# Patient Record
Sex: Male | Born: 1970 | Race: Black or African American | Hispanic: No | Marital: Single | Smoking: Former smoker
Health system: Southern US, Community
[De-identification: ages and names within clinical notes are randomized; demographics above are authoritative.]

## PROBLEM LIST (undated history)

## (undated) DIAGNOSIS — I10 Essential (primary) hypertension: Secondary | ICD-10-CM

## (undated) DIAGNOSIS — E119 Type 2 diabetes mellitus without complications: Secondary | ICD-10-CM

## (undated) HISTORY — DX: Type 2 diabetes mellitus without complications: E11.9

---

## 2004-07-21 ENCOUNTER — Emergency Department (HOSPITAL_COMMUNITY): Admission: EM | Admit: 2004-07-21 | Discharge: 2004-07-21 | Payer: Self-pay | Admitting: Emergency Medicine

## 2005-11-05 IMAGING — CR DG FINGER MIDDLE 2+V*L*
3 series · 3 of 3 positions shown · non-contrast
Comparison: None.

CLINICAL DATA: Swelling and pain at third DIP joint. 
 LEFT MIDDLE FINGER - 3 VIEW:

[x finger pa left]
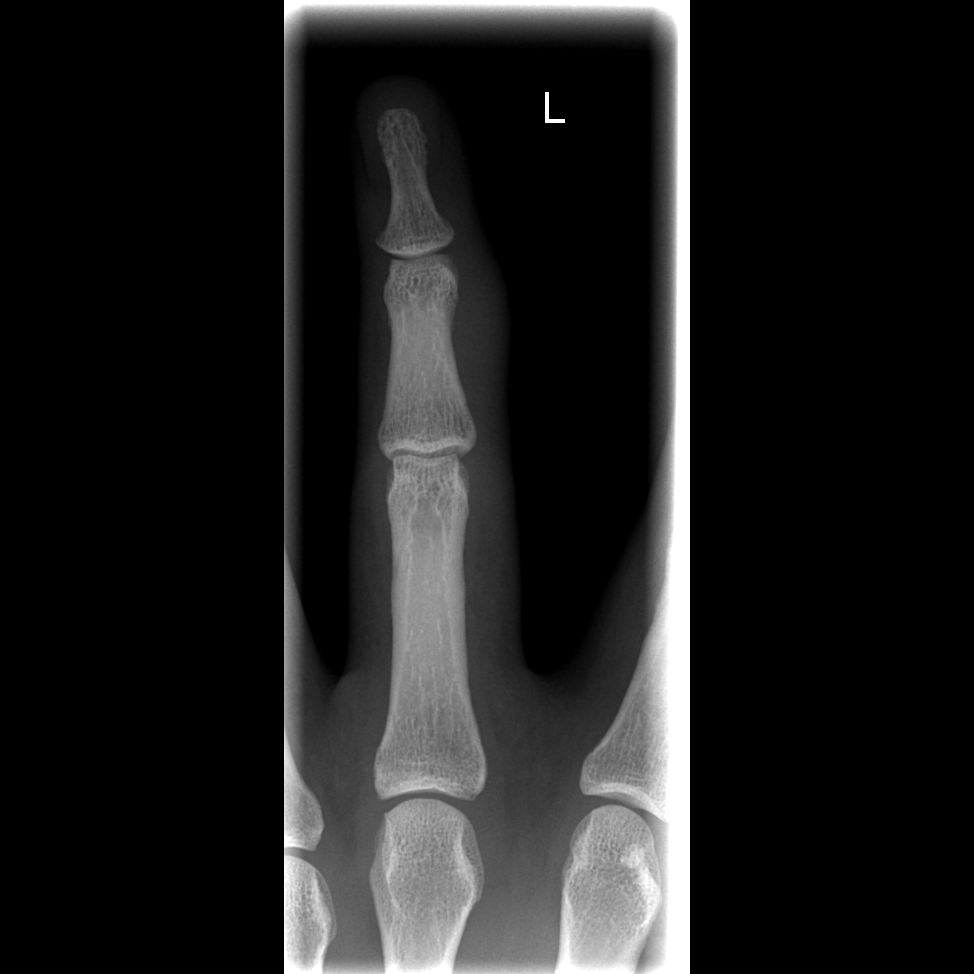

[x finger obl. left]
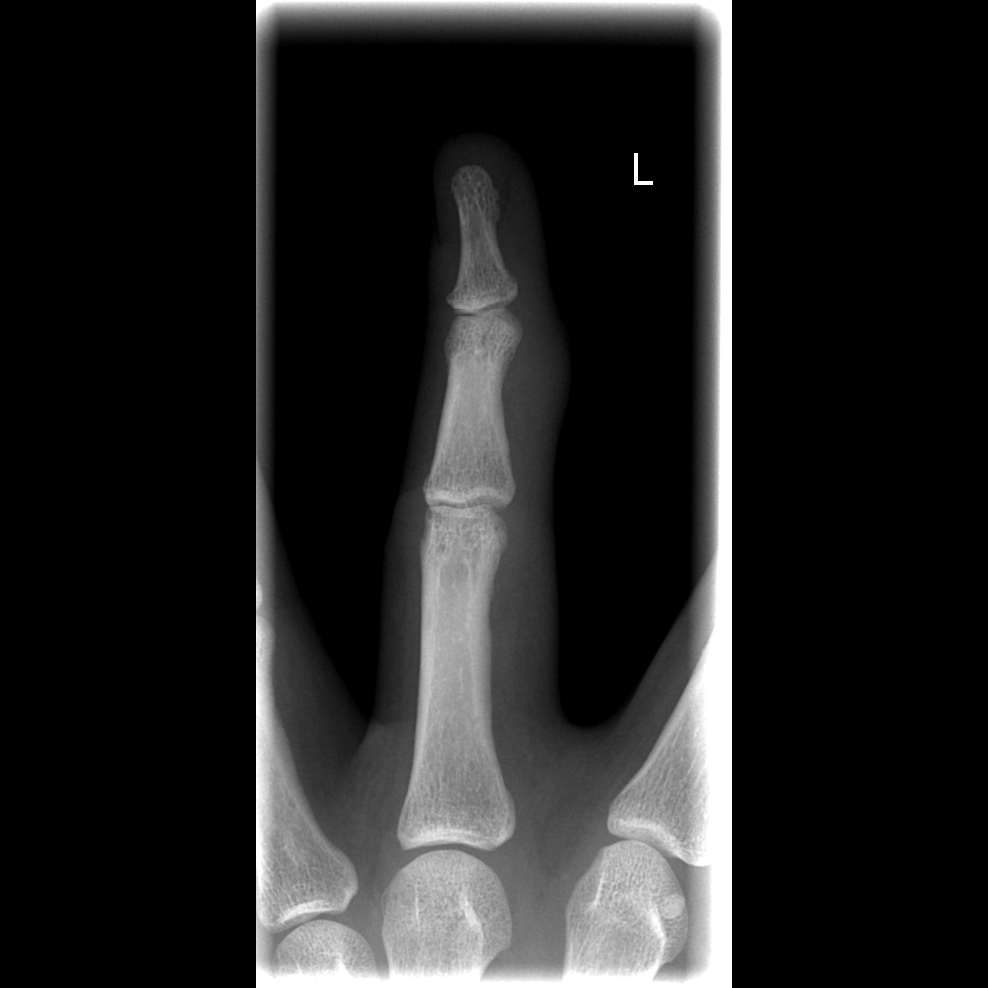

[x finger lateral left]
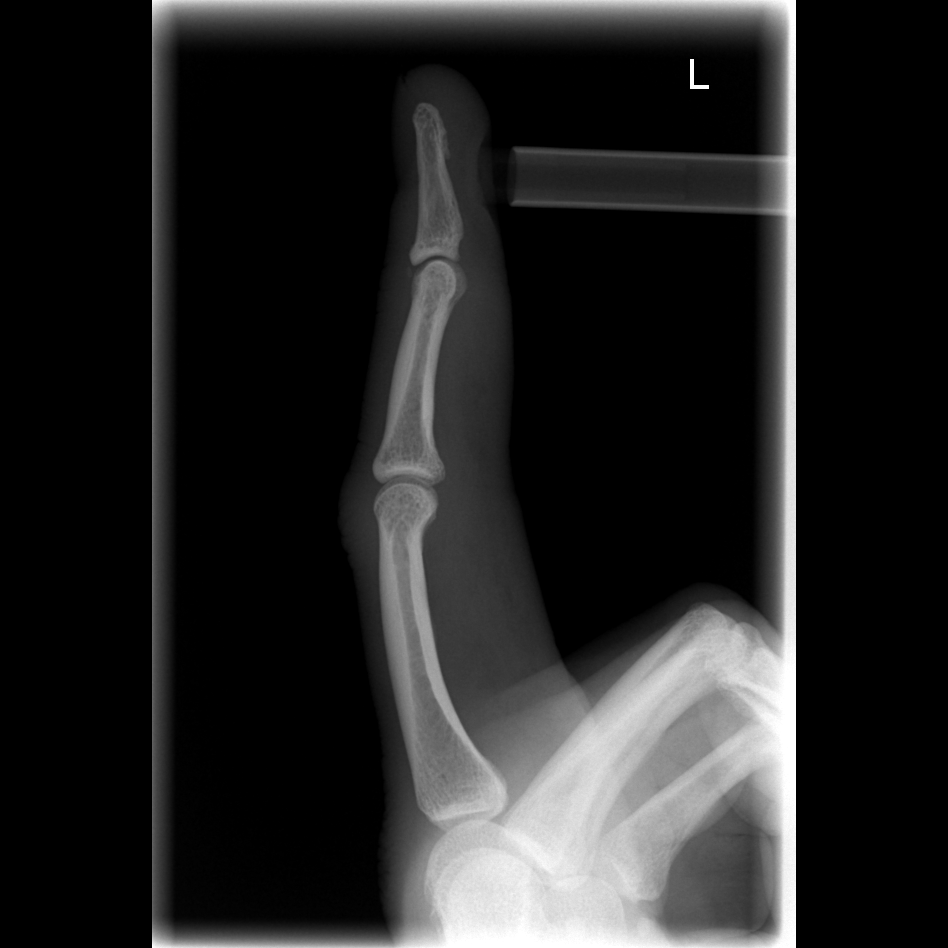

[3 of 3 positions shown; findings below may reference images not displayed]

There is soft tissue swelling adjacent to the third DIP joint.  No radiopaque foreign bodies are identified.  The underlying osseous structures appear intact.
IMPRESSION: Soft tissue swelling.

## 2006-01-09 ENCOUNTER — Emergency Department (HOSPITAL_COMMUNITY): Admission: EM | Admit: 2006-01-09 | Discharge: 2006-01-09 | Payer: Self-pay | Admitting: Emergency Medicine

## 2006-02-11 ENCOUNTER — Ambulatory Visit: Payer: Self-pay | Admitting: Internal Medicine

## 2006-02-15 ENCOUNTER — Emergency Department (HOSPITAL_COMMUNITY): Admission: EM | Admit: 2006-02-15 | Discharge: 2006-02-15 | Payer: Self-pay | Admitting: Emergency Medicine

## 2006-02-27 ENCOUNTER — Ambulatory Visit: Payer: Self-pay | Admitting: *Deleted

## 2006-03-13 ENCOUNTER — Ambulatory Visit: Payer: Self-pay | Admitting: Internal Medicine

## 2006-11-26 ENCOUNTER — Encounter (INDEPENDENT_AMBULATORY_CARE_PROVIDER_SITE_OTHER): Payer: Self-pay | Admitting: *Deleted

## 2007-04-26 IMAGING — CR DG CHEST 2V
2 series · 2 of 2 positions shown · non-contrast
Comparison: None

CLINICAL DATA: Arrhythmia. Smoker. 
 CHEST - 2 VIEW:

[w chest pa]
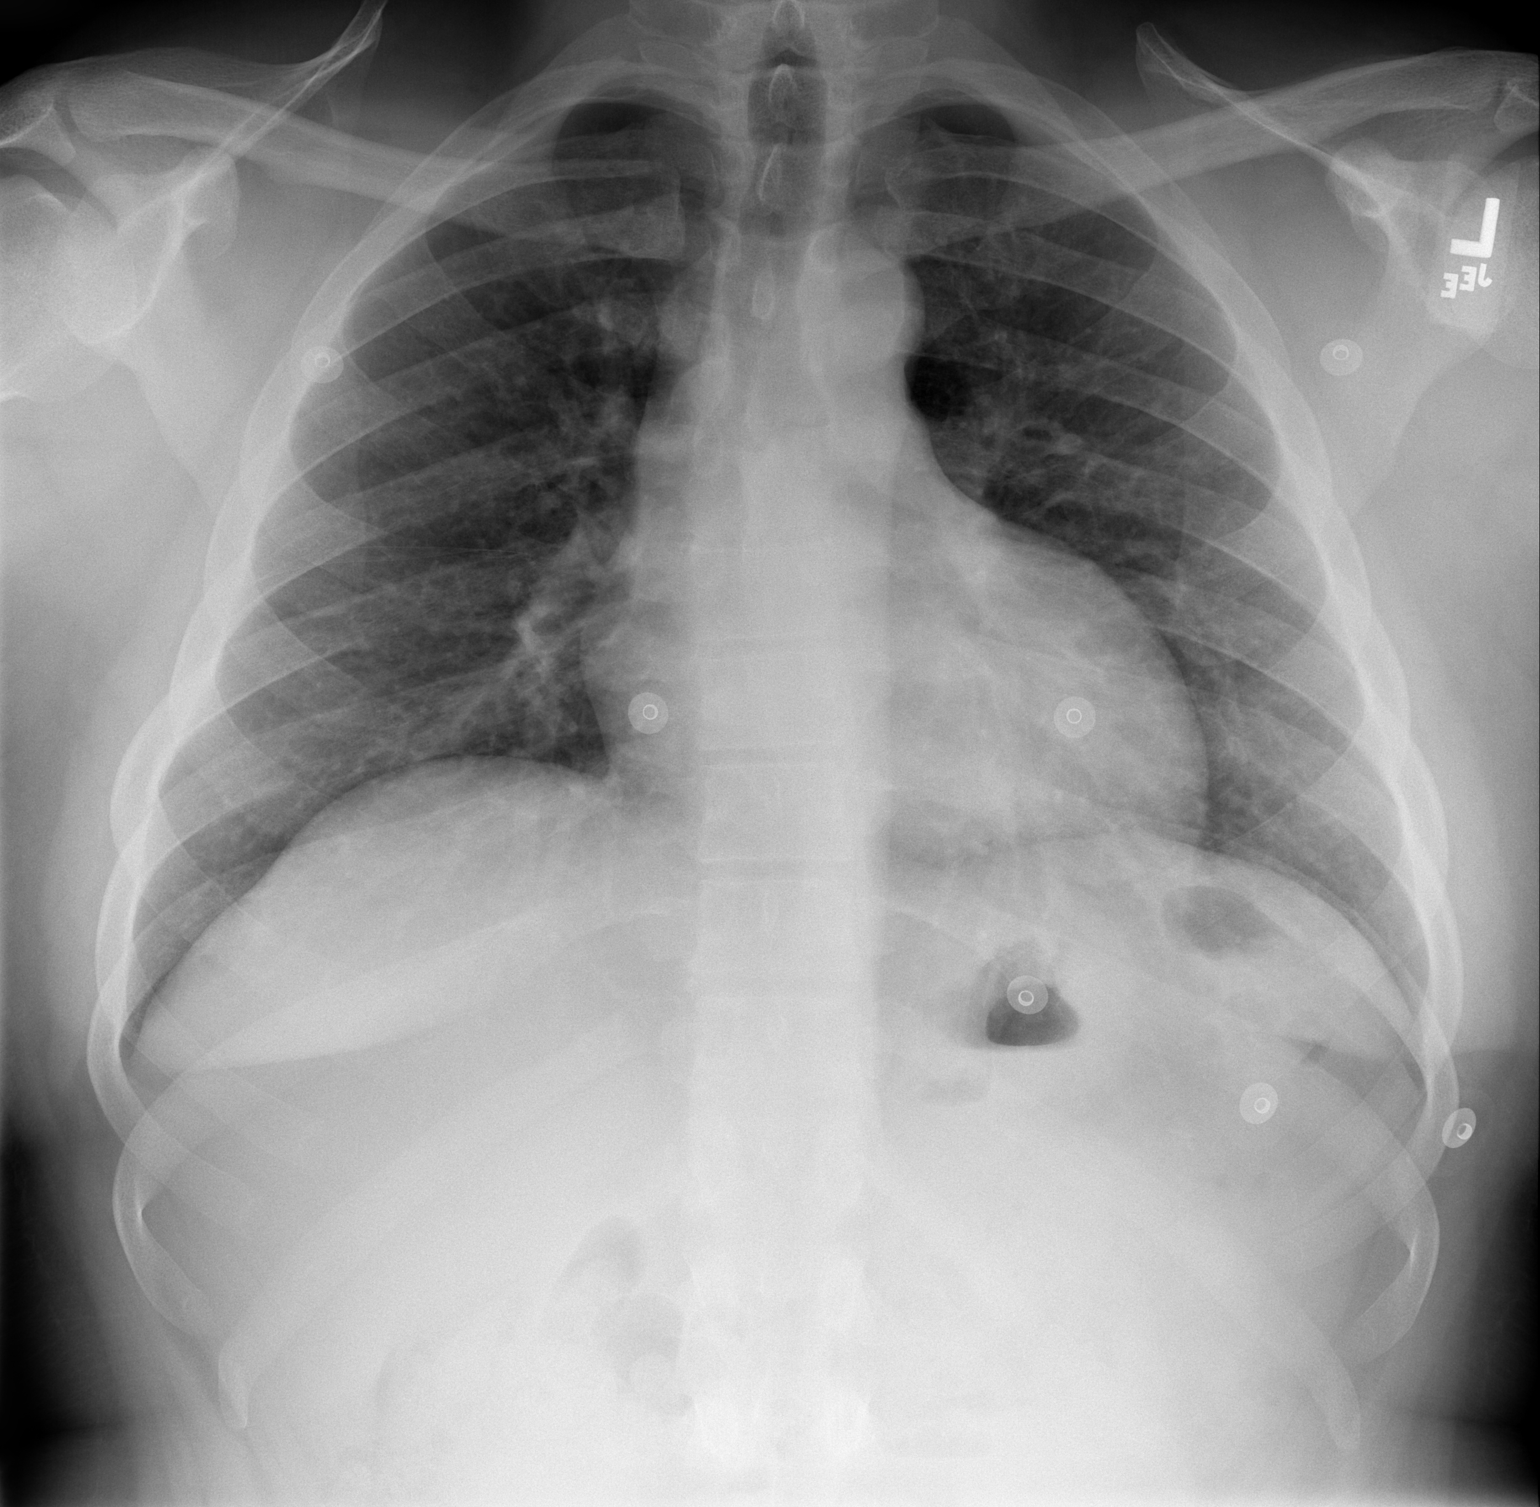

[w chest lat *]
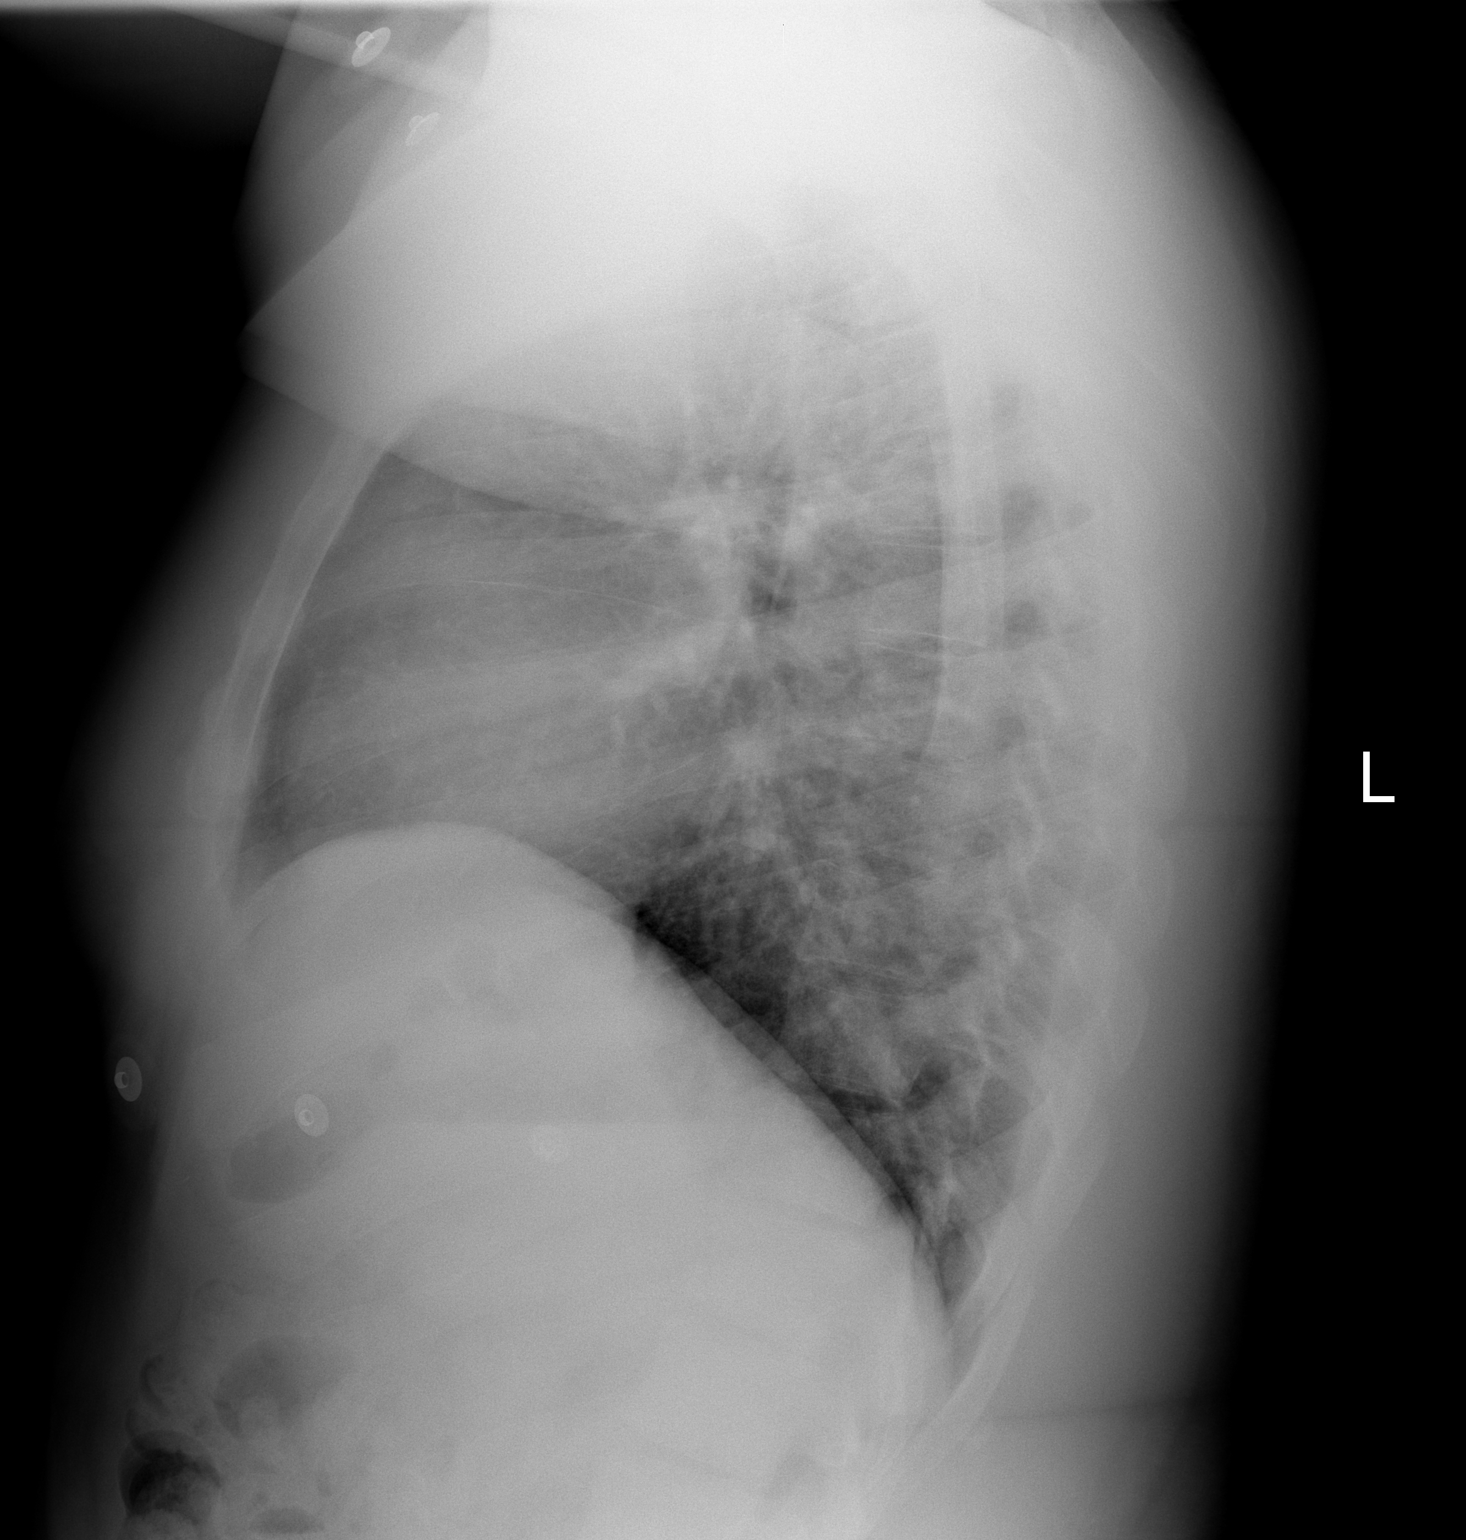

[2 of 2 positions shown; findings below may reference images not displayed]

FINDINGS: Trachea is midline.  Heart size is within normal limits.  Lungs are clear.  No pleural fluid.
IMPRESSION: No acute findings.

## 2007-06-02 IMAGING — CR DG CHEST 1V PORT
1 series · 1 of 1 positions shown · non-contrast
Comparison: none

HISTORY: Chest pain, tachycardia

PORTABLE CHEST ONE VIEW:
Portable exam 8666 hours compared to 01/09/2006
Lordotic positioning.
Probably normal heart size for technique.
Normal mediastinal contours and pulmonary vascularity.
Lungs clear.
Multiple cardiac monitoring lines project over chest.
No pleural effusion or pneumothorax

[view not recorded]
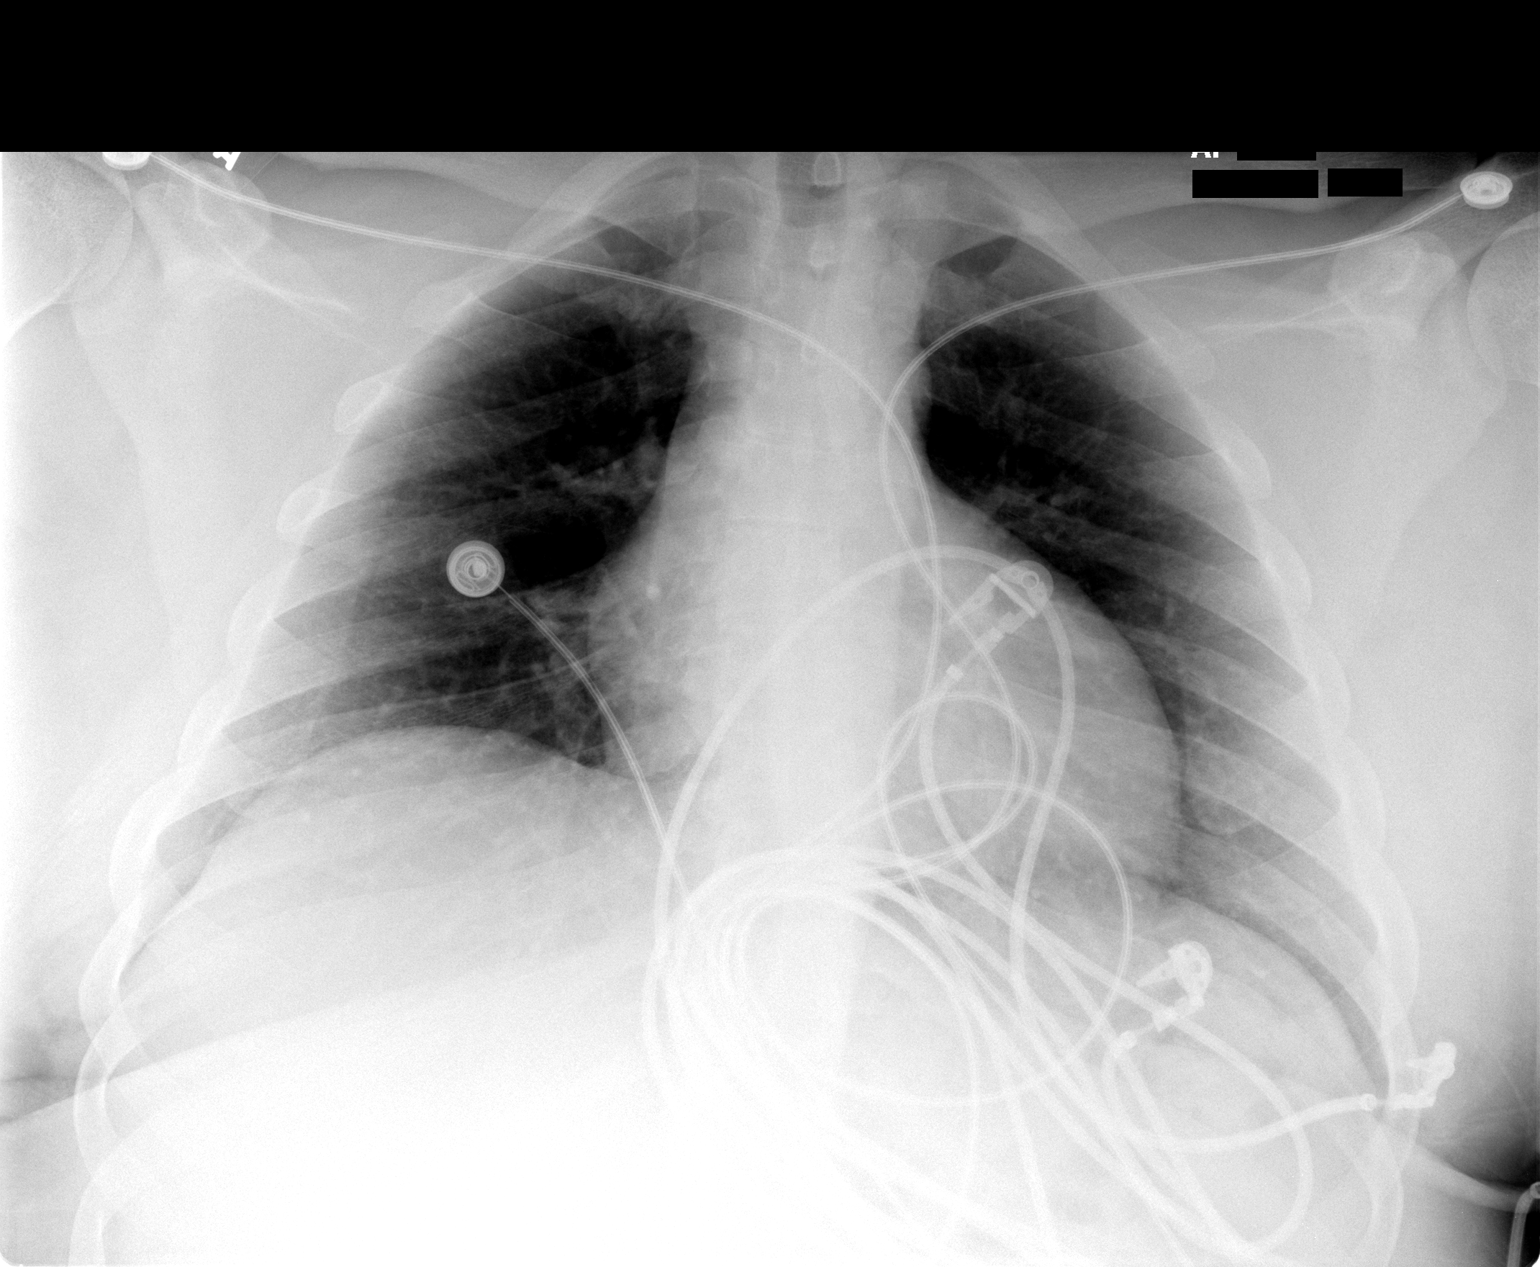

[1 of 1 positions shown; findings below may reference images not displayed]

IMPRESSION: No acute abnormalities.

## 2012-06-22 ENCOUNTER — Encounter (HOSPITAL_COMMUNITY): Payer: Self-pay | Admitting: *Deleted

## 2012-06-22 ENCOUNTER — Emergency Department (HOSPITAL_COMMUNITY)
Admission: EM | Admit: 2012-06-22 | Discharge: 2012-06-23 | Disposition: A | Discharge status: Home / Self Care | Payer: Self-pay | Attending: Emergency Medicine | Admitting: Emergency Medicine

## 2012-06-22 DIAGNOSIS — R509 Fever, unspecified: Secondary | ICD-10-CM | POA: Insufficient documentation

## 2012-06-22 DIAGNOSIS — F17200 Nicotine dependence, unspecified, uncomplicated: Secondary | ICD-10-CM | POA: Insufficient documentation

## 2012-06-22 DIAGNOSIS — R000 Tachycardia, unspecified: Secondary | ICD-10-CM | POA: Insufficient documentation

## 2012-06-22 DIAGNOSIS — R197 Diarrhea, unspecified: Secondary | ICD-10-CM | POA: Insufficient documentation

## 2012-06-22 DIAGNOSIS — I10 Essential (primary) hypertension: Secondary | ICD-10-CM | POA: Insufficient documentation

## 2012-06-22 DIAGNOSIS — R109 Unspecified abdominal pain: Secondary | ICD-10-CM | POA: Insufficient documentation

## 2012-06-22 DIAGNOSIS — R1110 Vomiting, unspecified: Secondary | ICD-10-CM | POA: Insufficient documentation

## 2012-06-22 HISTORY — DX: Essential (primary) hypertension: I10

## 2012-06-22 NOTE — ED Notes (Signed)
Pt c/o vomiting since this am; diarrhea; abd pain

## 2012-06-23 LAB — COMPREHENSIVE METABOLIC PANEL
ALT: 19 U/L (ref 0–53)
AST: 17 U/L (ref 0–37)
Albumin: 4.2 g/dL (ref 3.5–5.2)
Alkaline Phosphatase: 72 U/L (ref 39–117)
CO2: 25 mEq/L (ref 19–32)
Chloride: 99 mEq/L (ref 96–112)
GFR calc Af Amer: >90 mL/min (ref >90)
Potassium: 3.8 mEq/L (ref 3.5–5.1)
Sodium: 139 mEq/L (ref 135–145)
Total Bilirubin: 0.6 mg/dL (ref 0.3–1.2)

## 2012-06-23 LAB — CBC WITH DIFFERENTIAL/PLATELET
Eosinophils Absolute: 0 10*3/uL (ref 0.0–0.7)
Eosinophils Relative: 0 % (ref 0–5)
Lymphocytes Relative: 10 % — ABNORMAL LOW (ref 12–46)
MCHC: 34.7 g/dL (ref 30.0–36.0)
Neutro Abs: 13.3 10*3/uL — ABNORMAL HIGH (ref 1.7–7.7)
Neutrophils Relative %: 83 % — ABNORMAL HIGH (ref 43–77)
RDW: 13.3 % (ref 11.5–15.5)
WBC: 16 10*3/uL — ABNORMAL HIGH (ref 4.0–10.5)

## 2012-06-23 MED ORDER — ONDANSETRON HCL 8 MG PO TABS: 8.0000 mg | ORAL_TABLET | Freq: Three times a day (TID) | ORAL | Status: DC | PRN

## 2012-06-23 NOTE — ED Provider Notes (Signed)
History     CSN: [id number]  Arrival date & time 06/22/12  2226   First MD Initiated Contact with Patient 06/23/12 [phone]      Chief Complaint  Patient presents with  . Emesis    Patient is a 42 y.o. male presenting with vomiting. The history is provided by the patient.  Emesis Severity:  Moderate Duration:  1 day Timing:  Intermittent Number of daily episodes:  5-10 Quality:  Undigested food and stomach contents Able to tolerate:  Liquids Recent urination:  Normal Relieved by:  Nothing Associated symptoms: diarrhea and fever   No ill contacts.  Pt has had some abdominal cramping earlier.  Now it feels fine.  He is much better now.  He started drinking water and had some ginger ale too.  Since then he has felt much better. No prior abdominal surgery. Past Medical History  Diagnosis Date  . Hypertension     History reviewed. No pertinent past surgical history.  No family history on file.  History  Substance Use Topics  . Smoking status: Current Every Day Smoker  . Smokeless tobacco: Not on file  . Alcohol Use: No      Review of Systems  Gastrointestinal: Positive for vomiting and diarrhea.  All other systems reviewed and are negative.    Allergies  Review of patient's allergies indicates no known allergies.  Home Medications  No current outpatient prescriptions on file.  BP 178/81  Pulse 112  Temp(Src) 99.3 F (37.4 C)  Resp 20  SpO2 98%  Physical Exam  Nursing note and vitals reviewed. Constitutional: He appears well-developed and well-nourished. No distress.  HENT:  Head: Normocephalic and atraumatic.  Right Ear: External ear normal.  Left Ear: External ear normal.  Eyes: Conjunctivae are normal. Right eye exhibits no discharge. Left eye exhibits no discharge. No scleral icterus.  Neck: Neck supple. No tracheal deviation present.  Cardiovascular: Regular rhythm and intact distal pulses.   Mild tachycardia  Pulmonary/Chest: Effort normal and  breath sounds normal. No stridor. No respiratory distress. He has no wheezes. He has no rales.  Abdominal: Soft. Bowel sounds are normal. He exhibits no distension. There is no tenderness. There is no rebound and no guarding.  Musculoskeletal: He exhibits no edema and no tenderness.  Neurological: He is alert. He has normal strength. No sensory deficit. Cranial nerve deficit:  no gross defecits noted. He exhibits normal muscle tone. He displays no seizure activity. Coordination normal.  Skin: Skin is warm and dry. No rash noted.  Psychiatric: He has a normal mood and affect.    ED Course  Procedures (including critical care time)  Labs Reviewed  CBC WITH DIFFERENTIAL - Abnormal; Notable for the following:    WBC 16.0 (*)    Neutrophils Relative 83 (*)    Neutro Abs 13.3 (*)    Lymphocytes Relative 10 (*)    Monocytes Absolute 1.1 (*)    All other components within normal limits  COMPREHENSIVE METABOLIC PANEL - Abnormal; Notable for the following:    Glucose, Bld 118 (*)    Total Protein 8.6 (*)    GFR calc non Af Amer 82 (*)    All other components within normal limits  URINALYSIS, ROUTINE W REFLEX MICROSCOPIC   No results found.     MDM  The patient is feeling much better at this time. He has tolerated oral fluids. He may be mildly dehydrated at this point he do not feel that he requires IV fluids.  I did offer fluids the patient that he would prefer to continue with oral rehydration.  His abdominal exam is benign. I doubt diverticulitis, appendicitis, hepatitis or pancreatitis.        Celene Kras, MD 06/23/12 [phone]

## 2012-07-23 ENCOUNTER — Emergency Department (HOSPITAL_COMMUNITY)
Admission: EM | Admit: 2012-07-23 | Discharge: 2012-07-23 | Disposition: A | Discharge status: Home / Self Care | Payer: No Typology Code available for payment source | Attending: Emergency Medicine | Admitting: Emergency Medicine

## 2012-07-23 ENCOUNTER — Encounter (HOSPITAL_COMMUNITY): Payer: Self-pay | Admitting: *Deleted

## 2012-07-23 DIAGNOSIS — Y92410 Unspecified street and highway as the place of occurrence of the external cause: Secondary | ICD-10-CM | POA: Insufficient documentation

## 2012-07-23 DIAGNOSIS — F17200 Nicotine dependence, unspecified, uncomplicated: Secondary | ICD-10-CM | POA: Insufficient documentation

## 2012-07-23 DIAGNOSIS — T148XXA Other injury of unspecified body region, initial encounter: Secondary | ICD-10-CM

## 2012-07-23 DIAGNOSIS — Y9389 Activity, other specified: Secondary | ICD-10-CM | POA: Insufficient documentation

## 2012-07-23 DIAGNOSIS — I10 Essential (primary) hypertension: Secondary | ICD-10-CM | POA: Insufficient documentation

## 2012-07-23 DIAGNOSIS — S335XXA Sprain of ligaments of lumbar spine, initial encounter: Secondary | ICD-10-CM | POA: Insufficient documentation

## 2012-07-23 MED ORDER — METHOCARBAMOL 500 MG PO TABS: 500.0000 mg | ORAL_TABLET | Freq: Two times a day (BID) | ORAL | Status: DC
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
## 2012-07-25 NOTE — ED Provider Notes (Signed)
Medical screening examination/treatment/procedure(s) were performed by non-physician practitioner and as supervising physician I was immediately available for consultation/collaboration.  Sherae Santino, MD 07/25/12 0717 

## 2012-07-29 ENCOUNTER — Emergency Department (HOSPITAL_COMMUNITY)
Admission: EM | Admit: 2012-07-29 | Discharge: 2012-07-29 | Disposition: A | Discharge status: Home / Self Care | Payer: Self-pay | Attending: Emergency Medicine | Admitting: Emergency Medicine

## 2012-07-29 ENCOUNTER — Encounter (HOSPITAL_COMMUNITY): Payer: Self-pay | Admitting: Family Medicine

## 2012-07-29 DIAGNOSIS — I10 Essential (primary) hypertension: Secondary | ICD-10-CM | POA: Insufficient documentation

## 2012-07-29 DIAGNOSIS — F17200 Nicotine dependence, unspecified, uncomplicated: Secondary | ICD-10-CM | POA: Insufficient documentation

## 2012-07-29 LAB — POCT I-STAT, CHEM 8
BUN: 11 mg/dL (ref 6–23)
Calcium, Ion: 1.21 mmol/L (ref 1.12–1.23)
Chloride: 108 mEq/L (ref 96–112)
Creatinine, Ser: 1.2 mg/dL (ref 0.50–1.35)
HCT: 44 % (ref 39.0–52.0)
Potassium: 3.8 mEq/L (ref 3.5–5.1)
Sodium: 144 mEq/L (ref 135–145)
TCO2: 30 mmol/L (ref 0–100)

## 2012-07-29 LAB — URINALYSIS, ROUTINE W REFLEX MICROSCOPIC

## 2012-07-29 MED ORDER — HYDROCHLOROTHIAZIDE 25 MG PO TABS: 12.5000 mg | ORAL_TABLET | Freq: Every day | ORAL | Status: DC

## 2012-07-29 NOTE — ED Provider Notes (Signed)
History     CSN: [id number]  Arrival date & time 07/29/12  1224   First MD Initiated Contact with Patient 07/29/12 1307      Chief Complaint  Patient presents with  . Hypertension    (Consider location/radiation/quality/duration/timing/severity/associated sxs/prior treatment) Patient is a 42 y.o. male presenting with hypertension.  Hypertension   Patient states he has a history of high blood pressure and was on hypertensive medications approximately 4 years ago, but is no longer recall what the medication was.  He states that while he was at his private doctor's office this morning his blood pressure was 218/125.  He has had high blood pressure the last couple of times he came to the emergency department as well.  Patient states that he did not have a primary care physician to followup with.  Symptoms are moderate.  Patient denies any headaches, change in vision, dark urine, abdominal pain, nausea, vomiting, new back pain, fever, night sweats or chills.  Past Medical History  Diagnosis Date  . Hypertension     History reviewed. No pertinent past surgical history.  History reviewed. No pertinent family history.  History  Substance Use Topics  . Smoking status: Current Every Day Smoker  . Smokeless tobacco: Not on file  . Alcohol Use: No      Review of Systems Ten systems reviewed and are negative for acute change, except as noted in the HPI.    Allergies  Review of patient's allergies indicates no known allergies.  Home Medications  No current outpatient prescriptions on file.  BP 173/90  Pulse 97  Temp(Src) 97.6 F (36.4 C) (Oral)  Ht 5\' 9"  (1.753 m)  Wt 270 lb (122.471 kg)  BMI 39.85 kg/m2  SpO2 98%  Physical Exam  Nursing note and vitals reviewed. Constitutional: He is oriented to person, place, and time. He appears well-developed and well-nourished. No distress.  HENT:  Head: Normocephalic and atraumatic.  Mouth/Throat: Oropharynx is clear and moist.  No oropharyngeal exudate.  Eyes: Conjunctivae and EOM are normal. Pupils are equal, round, and reactive to light. No scleral icterus.  Neck: Normal range of motion. Neck supple. No tracheal deviation present. No thyromegaly present.  Cardiovascular: Regular rhythm, normal heart sounds and intact distal pulses.   Mild tachycardia  Pulmonary/Chest: Effort normal and breath sounds normal. No stridor. No respiratory distress. He has no wheezes.  Abdominal: Soft.  Musculoskeletal: Normal range of motion. He exhibits no edema and no tenderness.  Neurological: He is alert and oriented to person, place, and time. Coordination normal.  Skin: Skin is warm and dry. No rash noted. He is not diaphoretic. No erythema. No pallor.  Psychiatric: He has a normal mood and affect. His behavior is normal.    ED Course  Procedures (including critical care time)  Labs Reviewed  POCT I-STAT, CHEM 8 - Abnormal; Notable for the following:    Glucose, Bld 115 (*)    All other components within normal limits  URINALYSIS, ROUTINE W REFLEX MICROSCOPIC   No results found.   No diagnosis found.    MDM  Hypertension Smoking cessation  42 year old male to emergency department for asymptomatic hypertension evaluation.  Good kidney function on i-STAT with normal urinalysis.  Discussed importance of regulating high blood pressure as well as smoking cessation.  Resource guide given for followup.  Patient will be discharged on low-dose HCTZ with strict return precautions.         Jaci Carrel, [place] 07/29/12 1528  Medical screening examination/treatment/procedure(s)  were performed by non-physician practitioner and as supervising physician I was immediately available for consultation/collaboration.  Derwood Kaplan, MD 07/29/12 1649

## 2012-07-29 NOTE — ED Notes (Signed)
Per pt he has been has had more than one occasion of hypertension. sts at the chiropractor it was 218/125.

## 2017-07-07 ENCOUNTER — Encounter: Payer: Self-pay | Admitting: Family Medicine

## 2017-07-07 ENCOUNTER — Ambulatory Visit (INDEPENDENT_AMBULATORY_CARE_PROVIDER_SITE_OTHER): Payer: BLUE CROSS/BLUE SHIELD | Admitting: Family Medicine

## 2017-07-07 VITALS — BP 162/90 | HR 98 | Ht 69.0 in | Wt 292.4 lb

## 2017-07-07 DIAGNOSIS — G4719 Other hypersomnia: Secondary | ICD-10-CM | POA: Diagnosis not present

## 2017-07-07 DIAGNOSIS — F1220 Cannabis dependence, uncomplicated: Secondary | ICD-10-CM

## 2017-07-07 DIAGNOSIS — R0683 Snoring: Secondary | ICD-10-CM

## 2017-07-07 DIAGNOSIS — I10 Essential (primary) hypertension: Secondary | ICD-10-CM | POA: Diagnosis not present

## 2017-07-07 LAB — COMPREHENSIVE METABOLIC PANEL
ALT: 21 U/L (ref 0–53)
AST: 19 U/L (ref 0–37)
Albumin: 4.5 g/dL (ref 3.5–5.2)
Alkaline Phosphatase: 60 U/L (ref 39–117)
BUN: 32 mg/dL — ABNORMAL HIGH (ref 6–23)
CO2: 28 meq/L (ref 19–32)
Calcium: 9.5 mg/dL (ref 8.4–10.5)
Chloride: 98 mEq/L (ref 96–112)
Creatinine, Ser: 1.61 mg/dL — ABNORMAL HIGH (ref 0.40–1.50)
GFR: 59.82 mL/min — AB (ref >60.00)
GLUCOSE: 187 mg/dL — AB (ref 70–99)
Potassium: 3.5 mEq/L (ref 3.5–5.1)
Sodium: 134 mEq/L — ABNORMAL LOW (ref 135–145)
Total Bilirubin: 0.8 mg/dL (ref 0.2–1.2)
Total Protein: 8.2 g/dL (ref 6.0–8.3)

## 2017-07-07 MED ORDER — LOSARTAN POTASSIUM 50 MG PO TABS: 50.0000 mg | ORAL_TABLET | Freq: Every day | ORAL | 1 refills | Status: DC

## 2017-07-07 NOTE — Assessment & Plan Note (Signed)
Counseled on consequences of smoking and substance abuse Advised cessation.

## 2017-07-07 NOTE — Patient Instructions (Signed)
It was nice to meet you today Cut back on your salt intake! Work on incorporating regular exercise Start losartan in addition to your other medications.  Hypertension Hypertension is another name for high blood pressure. High blood pressure forces your heart to work harder to pump blood. This can cause problems over time. There are two numbers in a blood pressure reading. There is a top number (systolic) over a bottom number (diastolic). It is best to have a blood pressure below 120/80. Healthy choices can help lower your blood pressure. You may need medicine to help lower your blood pressure if:  Your blood pressure cannot be lowered with healthy choices.  Your blood pressure is higher than 130/80.  Follow these instructions at home: Eating and drinking  If directed, follow the DASH eating plan. This diet includes: ? Filling half of your plate at each meal with fruits and vegetables. ? Filling one quarter of your plate at each meal with whole grains. Whole grains include whole wheat pasta, brown rice, and whole grain bread. ? Eating or drinking low-fat dairy products, such as skim milk or low-fat yogurt. ? Filling one quarter of your plate at each meal with low-fat (lean) proteins. Low-fat proteins include fish, skinless chicken, eggs, beans, and tofu. ? Avoiding fatty meat, cured and processed meat, or chicken with skin. ? Avoiding premade or processed food.  Eat less than 1,500 mg of salt (sodium) a day.  Limit alcohol use to no more than 1 drink a day for nonpregnant women and 2 drinks a day for men. One drink equals 12 oz of beer, 5 oz of wine, or 1 oz of hard liquor. Lifestyle  Work with your doctor to stay at a healthy weight or to lose weight. Ask your doctor what the best weight is for you.  Get at least 30 minutes of exercise that causes your heart to beat faster (aerobic exercise) most days of the week. This may include walking, swimming, or biking.  Get at least 30  minutes of exercise that strengthens your muscles (resistance exercise) at least 3 days a week. This may include lifting weights or pilates.  Do not use any products that contain nicotine or tobacco. This includes cigarettes and e-cigarettes. If you need help quitting, ask your doctor.  Check your blood pressure at home as told by your doctor.  Keep all follow-up visits as told by your doctor. This is important. Medicines  Take over-the-counter and prescription medicines only as told by your doctor. Follow directions carefully.  Do not skip doses of blood pressure medicine. The medicine does not work as well if you skip doses. Skipping doses also puts you at risk for problems.  Ask your doctor about side effects or reactions to medicines that you should watch for. Contact a doctor if:  You think you are having a reaction to the medicine you are taking.  You have headaches that keep coming back (recurring).  You feel dizzy.  You have swelling in your ankles.  You have trouble with your vision. Get help right away if:  You get a very bad headache.  You start to feel confused.  You feel weak or numb.  You feel faint.  You get very bad pain in your: ? Chest. ? Belly (abdomen).  You throw up (vomit) more than once.  You have trouble breathing. Summary  Hypertension is another name for high blood pressure.  Making healthy choices can help lower blood pressure. If your blood pressure  cannot be controlled with healthy choices, you may need to take medicine. This information is not intended to replace advice given to you by your health care provider. Make sure you discuss any questions you have with your health care provider. Document Released: 08/14/2007 Document Revised: 01/24/2016 Document Reviewed: 01/24/2016 Elsevier Interactive Patient Education  [employer].

## 2017-07-07 NOTE — Assessment & Plan Note (Signed)
Uncontrolled Will add on losartan Continue additional prescribed medications Encouraged to follow low salt diet and incorporate regular exercise/activity Encouraged weight loss.  Check CMP today.

## 2017-07-07 NOTE — Progress Notes (Signed)
Colin Garcia - 47 y.o. male MRN [id number]  Date of birth: Jul 09, 1970  Subjective Chief Complaint  Patient presents with  . Hypertension    HPI Colin Garcia is a 47 y.o. male here today to establish care with new PCP.  He has a history of HTN and was seen recently at [facility] in [place], [place] for accelerated HTN.   He tells me that BP at time of admission was around 240 systolic.  In the hospital he was monitored for a few days and started on amlodipine, chlorthalidone, carvedilol and hydralazine.  His BP remains elevated today, he denies any symptoms at this time.  He does admit to a high salt diet and believes he may have sleep apnea due to excessive snoring and daytime fatigue.  He engages in little activity or exercise.  He denies chest pain, shortness of breath, palpitations, headaches, vision changes.   ROS:  ROS completed and negative except as noted per HPI  No Known Allergies  Past Medical History:  Diagnosis Date  . Hypertension     History reviewed. No pertinent surgical history.  Social History   Socioeconomic History  . Marital status: Single    Spouse name: Not on file  . Number of children: Not on file  . Years of education: Not on file  . Highest education level: Not on file  Occupational History  . Not on file  Social Needs  . Financial resource strain: Not on file  . Food insecurity:    Worry: Not on file    Inability: Not on file  . Transportation needs:    Medical: Not on file    Non-medical: Not on file  Tobacco Use  . Smoking status: Current Every Day Smoker  . Smokeless tobacco: Never Used  Substance and Sexual Activity  . Alcohol use: No  . Drug use: Yes    Types: Marijuana    Comment: Daily  . Sexual activity: Not on file  Lifestyle  . Physical activity:    Days per week: Not on file    Minutes per session: Not on file  . Stress: Not on file  Relationships  . Social connections:    Talks on phone: Not on file    Gets  together: Not on file    Attends religious service: Not on file    Active member of club or organization: Not on file    Attends meetings of clubs or organizations: Not on file    Relationship status: Not on file  Other Topics Concern  . Not on file  Social History Narrative  . Not on file    Family History  Problem Relation Age of Onset  . Hypertension Father     Health Maintenance  Topic Date Due  . HIV Screening  11/28/1986  . TETANUS/TDAP  11/28/1990  . INFLUENZA VACCINE  10/09/2017    ----------------------------------------------------------------------------------------------------------------------------------------------------------------------------------------------------------------- Physical Exam BP (!) 162/90 (BP Location: Left Arm, Patient Position: Sitting, Cuff Size: Large)   Pulse 98   Ht  (1.753 m)   Wt 292 lb 6.4 oz (132.6 kg)   BMI 43.18 kg/m   Physical Exam  Constitutional: He is oriented to person, place, and time. He appears well-nourished. No distress.  HENT:  Head: Normocephalic and atraumatic.  Mouth/Throat: Oropharynx is clear and moist.  Eyes: No scleral icterus.  Neck: Neck supple. No thyromegaly present.  Cardiovascular: Normal rate, regular rhythm, normal heart sounds and intact distal pulses.  Pulmonary/Chest: Effort normal  and breath sounds normal. No respiratory distress. He has no wheezes.  Musculoskeletal: He exhibits no edema.  Lymphadenopathy:    He has no cervical adenopathy.  Neurological: He is alert and oriented to person, place, and time.  Psychiatric: He has a normal mood and affect. His behavior is normal.    ------------------------------------------------------------------------------------------------------------------------------------------------------------------------------------------------------------------- Assessment and Plan  Essential hypertension Uncontrolled Will add on losartan Continue additional  prescribed medications Encouraged to follow low salt diet and incorporate regular exercise/activity Encouraged weight loss.  Check CMP today.  Excessive daytime sleepiness Needs eval for OSA.  Referral placed for sleep study  Marijuana dependence (HCC) Counseled on consequences of smoking and substance abuse Advised cessation.

## 2017-07-07 NOTE — Assessment & Plan Note (Signed)
Needs eval for OSA.  Referral placed for sleep study

## 2017-07-16 ENCOUNTER — Telehealth: Payer: Self-pay | Admitting: Emergency Medicine

## 2017-07-16 NOTE — Telephone Encounter (Signed)
113/75 is ok but 97/60 is a little low.  If he continues to have readings that with top number <100 or bottom number <60 or he is having symptoms such as dizziness or fatigue I would recommend that he cut the losartan in half

## 2017-07-16 NOTE — Telephone Encounter (Signed)
Copied from CRM [id number]. Topic: Quick Communication - See Telephone Encounter >> Jul 16, 2017  8:32 AM Herby Abraham C wrote: CRM for notification. See Telephone encounter for: 07/16/17.  Pt says that when he came in to see provider his BP was 160/92 and provider prescribed Losartan. Pt says that he checked his BP last night and it read 97/60 and then this morning and it read 113/75. Pt's concern is that his BP dropped to fast and in his opinion to low. Pt would like to discuss further to be sure that this is okay. Pt says that he is not sure about taking medication today. He would like a call back as soon as possible to be advised.    CB: [phone]

## 2017-07-16 NOTE — Telephone Encounter (Signed)
Spoke with patient regarding blood pressure readings and per Dr. Ashley Royalty he wants patient to hold off on taking BP medications this morning and check blood pressure this afternoon and then take blood pressure medications based off reading. Also instead of taking the full losartan Dr. Ashley Royalty wants him to take a half of the losartan. Patient states that he will check this afternoon and take his medication based off the readings. Patient has provided his readings from yesterday.  07/15/2017 7:00am after taking the coreg 93/74 1:20pm 111/66 P85 6:41pm 110/70 P83 10:42pm 100/67 5:00am138/83 before taking the Hydralazine

## 2017-07-17 NOTE — Telephone Encounter (Signed)
Spoke with patient and gave PCP recommendations based off readings. Patient understood and had no further concerns or questions.

## 2017-07-17 NOTE — Telephone Encounter (Signed)
Pt called to inform of BP readings today and is asking when he should take the other 1/2 dose of losartan (or if he should). Call back [phone]  07/17/17 8:00am 143/90 then took hydralazine 9:00am took 1/2 losartan 11:00am 142/88 12:40pm 125/80  07/16/17 5:38pm bp 122/73 pulse 87 10:00pm bp 122/82 pulse 90

## 2017-07-17 NOTE — Telephone Encounter (Signed)
Based on current readings would recommend continuation of 1/2 tab of losartan.

## 2017-07-17 NOTE — Telephone Encounter (Signed)
Please advise 

## 2017-07-28 ENCOUNTER — Encounter: Payer: Self-pay | Admitting: Family Medicine

## 2017-07-28 ENCOUNTER — Ambulatory Visit (INDEPENDENT_AMBULATORY_CARE_PROVIDER_SITE_OTHER): Payer: BLUE CROSS/BLUE SHIELD | Admitting: Family Medicine

## 2017-07-28 VITALS — BP 120/86 | HR 86 | Wt 292.4 lb

## 2017-07-28 DIAGNOSIS — R7309 Other abnormal glucose: Secondary | ICD-10-CM | POA: Diagnosis not present

## 2017-07-28 DIAGNOSIS — I10 Essential (primary) hypertension: Secondary | ICD-10-CM

## 2017-07-28 DIAGNOSIS — R7989 Other specified abnormal findings of blood chemistry: Secondary | ICD-10-CM

## 2017-07-28 LAB — BASIC METABOLIC PANEL
BUN: 23 mg/dL (ref 6–23)
CALCIUM: 9.3 mg/dL (ref 8.4–10.5)
CHLORIDE: 100 meq/L (ref 96–112)
CO2: 27 meq/L (ref 19–32)
CREATININE: 1.38 mg/dL (ref 0.40–1.50)
GFR: 71.45 mL/min (ref >60.00)
Glucose, Bld: 125 mg/dL — ABNORMAL HIGH (ref 70–99)
Potassium: 3.6 mEq/L (ref 3.5–5.1)
Sodium: 137 mEq/L (ref 135–145)

## 2017-07-28 LAB — HEMOGLOBIN A1C: HEMOGLOBIN A1C: 6.8 % — AB (ref 4.6–6.5)

## 2017-07-28 NOTE — Assessment & Plan Note (Signed)
Check hgba1c today.  

## 2017-07-28 NOTE — Assessment & Plan Note (Signed)
Bp much better controlled today Recommend continuation of current medication Continue to work on sodium reduction Instructed to start walking program Recheck renal function and potassium.

## 2017-07-28 NOTE — Patient Instructions (Signed)
DASH Eating Plan DASH stands for "Dietary Approaches to Stop Hypertension." The DASH eating plan is a healthy eating plan that has been shown to reduce high blood pressure (hypertension). It may also reduce your risk for type 2 diabetes, heart disease, and stroke. The DASH eating plan may also help with weight loss. What are tips for following this plan? General guidelines  Avoid eating more than 2,300 mg (milligrams) of salt (sodium) a day. If you have hypertension, you may need to reduce your sodium intake to 1,500 mg a day.  Limit alcohol intake to no more than 1 drink a day for nonpregnant women and 2 drinks a day for men. One drink equals 12 oz of beer, 5 oz of wine, or 1 oz of hard liquor.  Work with your health care provider to maintain a healthy body weight or to lose weight. Ask what an ideal weight is for you.  Get at least 30 minutes of exercise that causes your heart to beat faster (aerobic exercise) most days of the week. Activities may include walking, swimming, or biking.  Work with your health care provider or diet and nutrition specialist (dietitian) to adjust your eating plan to your individual calorie needs. Reading food labels  Check food labels for the amount of sodium per serving. Choose foods with less than 5 percent of the Daily Value of sodium. Generally, foods with less than 300 mg of sodium per serving fit into this eating plan.  To find whole grains, look for the word "whole" as the first word in the ingredient list. Shopping  Buy products labeled as "low-sodium" or "no salt added."  Buy fresh foods. Avoid canned foods and premade or frozen meals. Cooking  Avoid adding salt when cooking. Use salt-free seasonings or herbs instead of table salt or sea salt. Check with your health care provider or pharmacist before using salt substitutes.  Do not fry foods. Cook foods using healthy methods such as baking, boiling, grilling, and broiling instead.  Cook with  heart-healthy oils, such as olive, canola, soybean, or sunflower oil. Meal planning   Eat a balanced diet that includes: ? 5 or more servings of fruits and vegetables each day. At each meal, try to fill half of your plate with fruits and vegetables. ? Up to 6-8 servings of whole grains each day. ? Less than 6 oz of lean meat, poultry, or fish each day. A 3-oz serving of meat is about the same size as a deck of cards. One egg equals 1 oz. ? 2 servings of low-fat dairy each day. ? A serving of nuts, seeds, or beans 5 times each week. ? Heart-healthy fats. Healthy fats called Omega-3 fatty acids are found in foods such as flaxseeds and coldwater fish, like sardines, salmon, and mackerel.  Limit how much you eat of the following: ? Canned or prepackaged foods. ? Food that is high in trans fat, such as fried foods. ? Food that is high in saturated fat, such as fatty meat. ? Sweets, desserts, sugary drinks, and other foods with added sugar. ? Full-fat dairy products.  Do not salt foods before eating.  Try to eat at least 2 vegetarian meals each week.  Eat more home-cooked food and less restaurant, buffet, and fast food.  When eating at a restaurant, ask that your food be prepared with less salt or no salt, if possible. What foods are recommended? The items listed may not be a complete list. Talk with your dietitian about what   dietary choices are best for you. Grains Whole-grain or whole-wheat bread. Whole-grain or whole-wheat pasta. Brown rice. Oatmeal. Quinoa. Bulgur. Whole-grain and low-sodium cereals. Pita bread. Low-fat, low-sodium crackers. Whole-wheat flour tortillas. Vegetables Fresh or frozen vegetables (raw, steamed, roasted, or grilled). Low-sodium or reduced-sodium tomato and vegetable juice. Low-sodium or reduced-sodium tomato sauce and tomato paste. Low-sodium or reduced-sodium canned vegetables. Fruits All fresh, dried, or frozen fruit. Canned fruit in natural juice (without  added sugar). Meat and other protein foods Skinless chicken or turkey. Ground chicken or turkey. Pork with fat trimmed off. Fish and seafood. Egg whites. Dried beans, peas, or lentils. Unsalted nuts, nut butters, and seeds. Unsalted canned beans. Lean cuts of beef with fat trimmed off. Low-sodium, lean deli meat. Dairy Low-fat (1%) or fat-free (skim) milk. Fat-free, low-fat, or reduced-fat cheeses. Nonfat, low-sodium ricotta or cottage cheese. Low-fat or nonfat yogurt. Low-fat, low-sodium cheese. Fats and oils Soft margarine without trans fats. Vegetable oil. Low-fat, reduced-fat, or light mayonnaise and salad dressings (reduced-sodium). Canola, safflower, olive, soybean, and sunflower oils. Avocado. Seasoning and other foods Herbs. Spices. Seasoning mixes without salt. Unsalted popcorn and pretzels. Fat-free sweets. What foods are not recommended? The items listed may not be a complete list. Talk with your dietitian about what dietary choices are best for you. Grains Baked goods made with fat, such as croissants, muffins, or some breads. Dry pasta or rice meal packs. Vegetables Creamed or fried vegetables. Vegetables in a cheese sauce. Regular canned vegetables (not low-sodium or reduced-sodium). Regular canned tomato sauce and paste (not low-sodium or reduced-sodium). Regular tomato and vegetable juice (not low-sodium or reduced-sodium). Pickles. Olives. Fruits Canned fruit in a light or heavy syrup. Fried fruit. Fruit in cream or butter sauce. Meat and other protein foods Fatty cuts of meat. Ribs. Fried meat. Bacon. Sausage. Bologna and other processed lunch meats. Salami. Fatback. Hotdogs. Bratwurst. Salted nuts and seeds. Canned beans with added salt. Canned or smoked fish. Whole eggs or egg yolks. Chicken or turkey with skin. Dairy Whole or 2% milk, cream, and half-and-half. Whole or full-fat cream cheese. Whole-fat or sweetened yogurt. Full-fat cheese. Nondairy creamers. Whipped toppings.  Processed cheese and cheese spreads. Fats and oils Butter. Stick margarine. Lard. Shortening. Ghee. Bacon fat. Tropical oils, such as coconut, palm kernel, or palm oil. Seasoning and other foods Salted popcorn and pretzels. Onion salt, garlic salt, seasoned salt, table salt, and sea salt. Worcestershire sauce. Tartar sauce. Barbecue sauce. Teriyaki sauce. Soy sauce, including reduced-sodium. Steak sauce. Canned and packaged gravies. Fish sauce. Oyster sauce. Cocktail sauce. Horseradish that you find on the shelf. Ketchup. Mustard. Meat flavorings and tenderizers. Bouillon cubes. Hot sauce and Tabasco sauce. Premade or packaged marinades. Premade or packaged taco seasonings. Relishes. Regular salad dressings. Where to find more information:  National Heart, Lung, and Blood Institute: www.nhlbi.nih.gov  American Heart Association: www.heart.org Summary  The DASH eating plan is a healthy eating plan that has been shown to reduce high blood pressure (hypertension). It may also reduce your risk for type 2 diabetes, heart disease, and stroke.  With the DASH eating plan, you should limit salt (sodium) intake to 2,300 mg a day. If you have hypertension, you may need to reduce your sodium intake to 1,500 mg a day.  When on the DASH eating plan, aim to eat more fresh fruits and vegetables, whole grains, lean proteins, low-fat dairy, and heart-healthy fats.  Work with your health care provider or diet and nutrition specialist (dietitian) to adjust your eating plan to your individual   calorie needs. This information is not intended to replace advice given to you by your health care provider. Make sure you discuss any questions you have with your health care provider. Document Released: 02/14/2011 Document Revised: 02/19/2016 Document Reviewed: 02/19/2016 Elsevier Interactive Patient Education  2018 Elsevier Inc.  

## 2017-07-28 NOTE — Progress Notes (Signed)
Colin Garcia - 47 y.o. male MRN [id number]  Date of birth: 1970-10-03  Subjective Chief Complaint  Patient presents with  . Hypertension    follow up     HPI Colin Garcia is a 47 y.o. male here today for follow up of hypertension.  He reports he is taking all prescribed medication as directed.  He is tolerating this well.  BP at home today was 114/70, he denies symptoms of hypotension.  He is working on reducing his sodium intake but has not yet started any type of exercise program.  He has not had anginal symptoms, headaches, vision change, shortness of breath, edema, dizziness.   ROS: ROS completed and negative except as noted per HPI  No Known Allergies  Past Medical History:  Diagnosis Date  . Hypertension     No past surgical history on file.  Social History   Socioeconomic History  . Marital status: Single    Spouse name: Not on file  . Number of children: Not on file  . Years of education: Not on file  . Highest education level: Not on file  Occupational History  . Not on file  Social Needs  . Financial resource strain: Not on file  . Food insecurity:    Worry: Not on file    Inability: Not on file  . Transportation needs:    Medical: Not on file    Non-medical: Not on file  Tobacco Use  . Smoking status: Current Every Day Smoker  . Smokeless tobacco: Never Used  Substance and Sexual Activity  . Alcohol use: No  . Drug use: Yes    Types: Marijuana    Comment: Daily  . Sexual activity: Not on file  Lifestyle  . Physical activity:    Days per week: Not on file    Minutes per session: Not on file  . Stress: Not on file  Relationships  . Social connections:    Talks on phone: Not on file    Gets together: Not on file    Attends religious service: Not on file    Active member of club or organization: Not on file    Attends meetings of clubs or organizations: Not on file    Relationship status: Not on file  Other Topics Concern  . Not on file    Social History Narrative  . Not on file    Family History  Problem Relation Age of Onset  . Hypertension Father     Health Maintenance  Topic Date Due  . HIV Screening  11/28/1986  . TETANUS/TDAP  11/28/1990  . INFLUENZA VACCINE  10/09/2017    ----------------------------------------------------------------------------------------------------------------------------------------------------------------------------------------------------------------- Physical Exam BP 120/86 (BP Location: Right Arm, Patient Position: Sitting, Cuff Size: Large)   Pulse 86   Wt 292 lb 6.4 oz (132.6 kg)   BMI 43.18 kg/m   Physical Exam  Constitutional: He is oriented to person, place, and time. He appears well-nourished. No distress.  HENT:  Head: Normocephalic and atraumatic.  Mouth/Throat: Oropharynx is clear and moist.  Eyes: No scleral icterus.  Neck: Neck supple. No thyromegaly present.  Cardiovascular: Normal rate, regular rhythm and normal heart sounds.  Pulmonary/Chest: Effort normal and breath sounds normal.  Neurological: He is alert and oriented to person, place, and time.  Psychiatric: He has a normal mood and affect. His behavior is normal.    ------------------------------------------------------------------------------------------------------------------------------------------------------------------------------------------------------------------- Assessment and Plan  Elevated glucose Check hgba1c today.   Essential hypertension Bp much better controlled today Recommend continuation of  current medication Continue to work on sodium reduction Instructed to start walking program Recheck renal function and potassium.

## 2017-07-29 ENCOUNTER — Telehealth: Payer: Self-pay | Admitting: Family Medicine

## 2017-07-29 NOTE — Telephone Encounter (Signed)
Pt called and given lab results. Documented in result note

## 2017-07-29 NOTE — Telephone Encounter (Unsigned)
Copied from CRM [id number]. Topic: General - Other >> Jul 29, 2017 10:05 AM Marylen Ponto wrote: Reason for CRM: Pt returned call to discuss lab results. Pt requested a call back.

## 2017-07-29 NOTE — Progress Notes (Signed)
Renal function has improved however his A1c is >6.5% indicating that he has diabetes.  Would recommend starting metformin  BID to help with control of blood sugar.   He should also follow a low carb diet (limit starchy foods, sweets, sugary beverages including any fruit juices).   -Please send in glucometer for him to monitor glucose at home

## 2017-07-30 MED ORDER — METFORMIN HCL 500 MG PO TABS: 500.0000 mg | ORAL_TABLET | Freq: Two times a day (BID) | ORAL | 1 refills | Status: DC

## 2017-07-30 NOTE — Addendum Note (Signed)
Addended by: Dominic Pea on: 07/30/2017 08:37 AM   Modules accepted: Orders

## 2017-07-31 ENCOUNTER — Telehealth: Payer: Self-pay

## 2017-07-31 NOTE — Telephone Encounter (Signed)
spokw with pt and informed him that his A1C was an average for 3 months and eating at 1130  Wouldn't have changed it. TLGCopied from CRM [id number]. Topic: Quick Communication - See Telephone Encounter >> Jul 31, 2017 11:02 AM Waymon Amato wrote: Pt is is needing to talk with someone about his A!C because he did eat sugar at 1130 the night before having it tested and would like to know if he should be tested again before starting his metformin or just go ahead with the metformin  Best number [phone]

## 2017-08-05 ENCOUNTER — Other Ambulatory Visit: Payer: Self-pay | Admitting: Family Medicine

## 2017-08-05 NOTE — Telephone Encounter (Signed)
Copied from CRM [id number]. Topic: Quick Communication - Rx Refill/Question >> Aug 05, 2017  3:32 PM Zada Girt, Lumin L wrote: Medication: hydrALAZINE (APRESOLINE) 10 MG tablet (1 day left), carvedilol (COREG) 12.5 MG tablet, amLODipine (NORVASC) 5 MG tablet, chlorthalidone (HYGROTON) 25 MG tablet   Has the patient contacted their pharmacy? No. (says PCP advised him to call to renew) (Agent: If no, request that the patient contact the pharmacy for the refill.) (Agent: If yes, when and what did the pharmacy advise?)  Preferred Pharmacy (with phone number or street name): [facility] [address] [place], [place] - [address] AT [facility] & [address] [place] [address] Phone: [phone] Fax: [phone]  Agent: Please be advised that RX refills may take up to 3 business days. We ask that you follow-up with your pharmacy.

## 2017-08-06 MED ORDER — CHLORTHALIDONE 25 MG PO TABS: 25.0000 mg | ORAL_TABLET | Freq: Every day | ORAL | 1 refills | Status: DC

## 2017-08-06 MED ORDER — HYDRALAZINE HCL 10 MG PO TABS: 10.0000 mg | ORAL_TABLET | Freq: Four times a day (QID) | ORAL | 1 refills | Status: DC

## 2017-08-06 MED ORDER — CARVEDILOL 12.5 MG PO TABS: ORAL_TABLET | ORAL | 1 refills | Status: DC

## 2017-08-06 MED ORDER — AMLODIPINE BESYLATE 5 MG PO TABS: 10.0000 mg | ORAL_TABLET | Freq: Every day | ORAL | 1 refills | Status: DC

## 2017-08-06 NOTE — Telephone Encounter (Signed)
Refill requests: Hydralazine 10 mg; historical provider Carvedilol 12.5 mg ; historical provider Amlodipine 5 mg; historical provider Chlorthalidone 25 mg; historical provider  Last office visit:  07/28/17  PCP Dr. Everrett Coombe  Pharmacy: [facility] on [facility] [address]

## 2017-08-21 ENCOUNTER — Ambulatory Visit (INDEPENDENT_AMBULATORY_CARE_PROVIDER_SITE_OTHER): Payer: BLUE CROSS/BLUE SHIELD | Admitting: Neurology

## 2017-08-21 ENCOUNTER — Encounter: Payer: Self-pay | Admitting: Neurology

## 2017-08-21 VITALS — BP 137/89 | HR 83 | Ht 69.0 in | Wt 287.0 lb

## 2017-08-21 DIAGNOSIS — G4726 Circadian rhythm sleep disorder, shift work type: Secondary | ICD-10-CM

## 2017-08-21 DIAGNOSIS — F5112 Insufficient sleep syndrome: Secondary | ICD-10-CM | POA: Diagnosis not present

## 2017-08-21 DIAGNOSIS — M2619 Other specified anomalies of jaw-cranial base relationship: Secondary | ICD-10-CM | POA: Diagnosis not present

## 2017-08-21 DIAGNOSIS — R0683 Snoring: Secondary | ICD-10-CM | POA: Diagnosis not present

## 2017-08-21 DIAGNOSIS — Z6841 Body Mass Index (BMI) 40.0 and over, adult: Secondary | ICD-10-CM

## 2017-08-21 DIAGNOSIS — R351 Nocturia: Secondary | ICD-10-CM

## 2017-08-21 DIAGNOSIS — I10 Essential (primary) hypertension: Secondary | ICD-10-CM | POA: Diagnosis not present

## 2017-08-21 NOTE — Progress Notes (Signed)
SLEEP MEDICINE CLINIC   Provider:  Melvyn Novas, M D  Primary Care Physician:  Everrett Coombe, DO   Referring Provider: Everrett Coombe, DO    Chief Complaint  Patient presents with  . New Patient (Initial Visit)    pt alone, rm 11. pt had elevated blood pressure issues and his PCP wanted to rule out sleep apnea. pt has been told that he snores and stops breathing in sleep. pt has never had a sleep study.    HPI:  Colin Garcia is a 47 y.o. male patient ,  seen here  in a referral from Dr. Ashley Royalty with whom he recently established primary care, and followed  for HTN.  He is here to see if he has OSA. He has been snoring for many years, and a friend had reported that  he witnessed apneas, too.   Mr. Colin Garcia is a 47 year old gentleman right-handed who has been treated for hypertension on multiple medicines.  His primary care physician coordinates that the patient had been seen at [facility] in [place] for accelerated - at that time critical hypertension.  His systolic blood pressure was 240 mmHg.  In the hospital he was monitored for several days and then started on amlodipine, chlorthalidone, carvedilol, hydrochlorothiazide and  Hydralazine. It remained not well controlled and he may have apnea contributing to these high pressures.  At admission his glucose was fine, but he was told his Hba1c was 6.9. He is habitually consuming high fructose, high glucose and a lot of processed food.  Eats fast food. He is now struggling to change these habits.   Sleep habits are as follows: He  Works second shift and every other weekend -usually watches TV in his bedroom before he falls asleep, and sleep time is somewhere between 2 and 3 AM.  He states that he can sleep through until the late morning and rises between 8 and 10 AM. He has 2 nocturias. He can go back to sleep.  His bedroom is cool, but not quiet and dark. He prefers to sleep supine- on his back- on a flat  bed with 1 or 2 pillows. Estimated sleep duration is 5-6 hours only.  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   Sleep medical history and family sleep history:  Father has OSA, CPAP.  History of one ENT surgery, no neck or head trauma. Sinus draining procedure  several years ago.   Social history:He is habitually consuming high fructose, high glucose and a lot of processed food.  Eats fast food. He is now struggling to change these habits.  History of shift work, used to work third, now second shift. Single, non smoker-no acohol use.  Caffeine - neither coffee, iced tea, nor soda in month.    Review of Systems: Out of a complete 14 system review, the patient complains of only the following symptoms, and all other reviewed systems are negative. Snoring, nocturia, sleep deprivation, not dreaming, not sleep walking.   Epworth score 6/ 24  Fatigue severity score 27  , depression score n/a    Social History   Socioeconomic History  . Marital status: Single    Spouse name: Not on file  . Number of children: Not on file  . Years of education: Not on file  . Highest education level: Not on file  Occupational History  . Not on file  Social Needs  . Financial resource strain: Not on file  . Food insecurity:    Worry: Not on file    Inability: Not on file  . Transportation needs:    Medical: Not on file    Non-medical: Not on file  Tobacco Use  . Smoking status: Former Smoker    Last attempt to quit: 2008    Years since quitting: 11.4  . Smokeless tobacco: Never Used  Substance and Sexual Activity  . Alcohol use: No  . Drug use: Yes    Types: Marijuana    Comment: Daily  . Sexual activity: Not on file  Lifestyle  . Physical activity:    Days per week: Not on file    Minutes per session: Not on file  . Stress: Not on file  Relationships  . Social connections:    Talks on phone:  Not on file    Gets together: Not on file    Attends religious service: Not on file    Active member of club or organization: Not on file    Attends meetings of clubs or organizations: Not on file    Relationship status: Not on file  . Intimate partner violence:    Fear of current or ex partner: Not on file    Emotionally abused: Not on file    Physically abused: Not on file    Forced sexual activity: Not on file  Other Topics Concern  . Not on file  Social History Narrative  . Not on file    Family History  Problem Relation Age of Onset  . Hypertension Father   . Diabetes Father     Past Medical History:  Diagnosis Date  . Diabetes mellitus without complication (HCC)   . Hypertension     No past surgical history on file.  Current Outpatient Medications  Medication Sig Dispense Refill  . amLODipine (NORVASC) 5 MG tablet Take 2 tablets (10 mg total) by mouth daily. 120 tablet 1  . carvedilol (COREG) 12.5 MG tablet take 2 tablets by mouth twice a day with meals 120 tablet 1  . chlorthalidone (HYGROTON) 25 MG tablet Take 1 tablet (25 mg total) by mouth daily. 90 tablet 1  . hydrALAZINE (APRESOLINE) 10 MG tablet Take 1 tablet (10 mg total) by mouth every 6 (six) hours. 120 tablet 1  . losartan (COZAAR) 50 MG tablet Take 1 tablet (50 mg total) by mouth daily. 90 tablet 1  . metFORMIN (GLUCOPHAGE) 500 MG tablet Take 1 tablet (500 mg total) by mouth 2 (two) times daily with a meal. 180 tablet 1   No current facility-administered medications for this visit.     Allergies as of 08/21/2017  . (No Known Allergies)    Vitals: BP 137/89   Pulse 83   Ht 5\' 9"  (1.753 m)   Wt 287 lb (130.2 kg)   BMI 42.38 kg/m  Last Weight:  Wt Readings from Last 1 Encounters:  08/21/17 287 lb (130.2 kg)   NWG:NFAOBMI:Body mass index is 42.38 kg/m.     Last Height:   Ht Readings from Last 1 Encounters:  08/21/17 5\' 9"  (1.753 m)    Physical exam:  General: The patient is awake, alert and  appears not in acute distress. The patient is well groomed. Head: Normocephalic, atraumatic. Neck is supple. Mallampati 3-4 ,   neck circumference:17. 75 . Nasal airflow patent ,. Retrognathia is seen.  Cardiovascular:  Regular rate and rhythm , without  murmurs or  carotid bruit, and without distended neck veins. Respiratory: Lungs are clear to auscultation. Skin:  Without evidence of edema, or rash Trunk: BMI is 42. 38 . The patient's posture is poor.   Neurologic exam : The patient is tired - but  oriented to place and time.    Attention span & concentration ability appears normal.  Speech is fluent,  without dysarthria, dysphonia or aphasia.  Mood and affect are appropriate.  Cranial nerves: Preserved sense of smell- but reportedly metallic taste changes - Pupils are equal and briskly reactive to light. . Extraocular movements  in vertical and horizontal planes intact and without nystagmus. Visual fields by finger perimetry are intact.Hearing to finger rub intact.  Facial sensation intact to fine touch. Facial motor strength is symmetric and tongue and uvula move midline. Shoulder shrug was symmetrical.   Motor exam:  Normal tone, muscle bulk and symmetric strength in all extremities. Sensory:  Fine touch, pinprick and vibration were tested in all extremities. Proprioception tested in the upper extremities was normal. Coordination: Rapid alternating movements in the fingers/hands was normal. Finger-to-nose maneuver  normal without evidence of ataxia, dysmetria or tremor. Gait and station: Patient walks without assistive device and is able unassisted to climb up to the exam table. Strength within normal limits.  Stance is stable and normal. Tandem gait is unfragmented. Turns with 3 Steps. Romberg testing is  negative.  Deep tendon reflexes: in the  upper and lower extremities are symmetric and intact. Babinski maneuver response is downgoing.   Assessment:  After physical and neurologic  examination, review of laboratory studies,  Personal review of imaging studies, reports of other /same  Imaging studies, results of polysomnography and / or neurophysiology testing and pre-existing records as far as provided in visit., my assessment is   1) thunderous snoring, apnea and nocturia . Check for OSA. Nocturia may improve as diabetic control increases.   2) wakes up with a dry mouth and sometimes headaches.   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    The patient was advised of the nature of the diagnosed disorder , the treatment options and the  risks for general health and wellness arising from not treating the condition.   I spent more than 45  minutes of face to face time with the patient.  Greater than 50% of time was spent in counseling and coordination of care. We have discussed the diagnosis and differential and I answered the patient's questions.    Plan:  Treatment plan and additional workup :  Hydrated better- with water.  Eliminate all electronics from the bedroom, turns the clocks away, cool temperature of 70 or less , use a box fan for background noises.  We will order a sleep study, accommodating your shift worker sleep time.   SPLIT night PSG with Co2.    Melvyn Novas, MD 08/21/2017, 10:22 AM  Certified in Neurology by ABPN Certified in Sleep Medicine by [facility] [address] [place], [place] [address]

## 2017-08-21 NOTE — Patient Instructions (Signed)
Please remember to try to maintain good sleep hygiene, which means: Keep a regular sleep and wake schedule, try not to exercise or have a meal within 2 hours of your bedtime, try to keep your bedroom conducive for sleep, that is, cool and dark, without light distractors such as an illuminated alarm clock, and refrain from watching TV right before sleep or in the middle of the night and do not keep the TV or radio on during the night. Also, try not to use or play on electronic devices at bedtime, such as your cell phone, tablet PC or laptop. If you like to read at bedtime on an electronic device, try to dim the background light as much as possible. Do not eat in the middle of your sleep, do not eat and drink in bed. .     We will request a sleep study in daytime .  We will look for leg twitching and snoring or sleep apnea.   For chronic insomnia, you are best followed by a psychiatrist and/or sleep psychologist.   We will call you with the sleep study results and make a follow up appointment if needed.

## 2017-08-25 ENCOUNTER — Telehealth: Payer: Self-pay

## 2017-08-25 ENCOUNTER — Other Ambulatory Visit: Payer: Self-pay | Admitting: Neurology

## 2017-08-25 DIAGNOSIS — F5112 Insufficient sleep syndrome: Secondary | ICD-10-CM

## 2017-08-25 DIAGNOSIS — I10 Essential (primary) hypertension: Secondary | ICD-10-CM

## 2017-08-25 DIAGNOSIS — R0683 Snoring: Secondary | ICD-10-CM

## 2017-08-25 DIAGNOSIS — G4726 Circadian rhythm sleep disorder, shift work type: Secondary | ICD-10-CM

## 2017-08-25 NOTE — Telephone Encounter (Signed)
BCBS denied in lab sleep study, Need HST order 

## 2017-09-29 ENCOUNTER — Ambulatory Visit (INDEPENDENT_AMBULATORY_CARE_PROVIDER_SITE_OTHER): Payer: BLUE CROSS/BLUE SHIELD | Admitting: Neurology

## 2017-09-29 DIAGNOSIS — I10 Essential (primary) hypertension: Secondary | ICD-10-CM

## 2017-09-29 DIAGNOSIS — G4731 Primary central sleep apnea: Secondary | ICD-10-CM

## 2017-09-29 DIAGNOSIS — F5112 Insufficient sleep syndrome: Secondary | ICD-10-CM

## 2017-09-29 DIAGNOSIS — G4733 Obstructive sleep apnea (adult) (pediatric): Secondary | ICD-10-CM | POA: Diagnosis not present

## 2017-09-29 DIAGNOSIS — R0683 Snoring: Secondary | ICD-10-CM

## 2017-09-29 DIAGNOSIS — G4726 Circadian rhythm sleep disorder, shift work type: Secondary | ICD-10-CM

## 2017-10-06 NOTE — Addendum Note (Signed)
Addended by: Melvyn NovasHMEIER, Kiran Lapine on: 10/06/2017 05:14 PM   Modules accepted: Orders

## 2017-10-06 NOTE — Procedures (Signed)
[facility] [address] [place], [place] [id number] NAME: Colin Garcia                                                                 DOB: Oct 14, 1970 MEDICAL RECORD NUMBER [id number]                                              DOS:  09/30/2017 REFERRING PHYSICIAN: Everrett Coombeody Matthews, D.O. STUDY PERFORMED: Home Sleep Study on watch pat  HISTORY:  Colin Garcia is a 47 y.o. male Patient referred from Dr. Ashley RoyaltyMatthews with whom he recently established primary care and is here to see if he has OSA. He has been snoring for many years, and a friend had reportedly witnessed apneas, too.   Mr. Colin Garcia is a 47 year old gentleman right-handed who has been treated for hypertension on multiple medicines.  His primary care physician coordinates the treatment- the patient had been seen at [facility] [place] -critical hypertension. His systolic blood pressure was 240 mmHg.  In the hospital he was monitored for several days and then started on amlodipine, chlorthalidone, carvedilol, hydrochlorothiazide and Hydralazine. HTN remained not well controlled and he may have apnea contributing to these high pressures. At admission his glucose was fine, but he was told his Hba1c was 6.9. He is habitually consuming high fructose, high glucose and a lot of processed fast food. He is now struggling to change these habits.    Epworth Sleepiness score endorsed at 6/ 24 points; Fatigue severity score at 27/63 points, BMI: 42.4  STUDY RESULTS:  Total Recording Time: 6 hours 29 minutes; valid test time 5 hours 48 minutes. Total Apnea/Hypopnea Index (AHI):  59.4 /h and RDI:  60.6 /h Average Oxygen Saturation:  94 %; Lowest Oxygen Saturation:  75%  Total Time Oxygen Saturation below 89% was 7.4 minutes  Average Heart Rate:  76 bpm (between 57 and 104 bpm) IMPRESSION: Severe Sleep Apnea, consistent of 70% Obstructive and 24.1 % Central Apneas. Moderate  Loud Snoring. No prolonged hypoxemia was noted and neither was Tachy-Bradycardia.   RECOMMENDATION: This degree of sleep apnea needs immediate attention- I recommend to use an auto-titration CPAP machine , heated humidity, interface of choice, and pressure window between 6 and 18 cm water with 3 cm EPR.  I certify that I have reviewed the raw data recording prior to the issuance of this report in accordance with the standards of the American Academy of Sleep Medicine (AASM). Melvyn Novasarmen Advit Trethewey, M.D.   10-06-2017      Medical Director of [facility], accredited by the AASM. Diplomat of the ABPN and ABSM.

## 2017-10-08 ENCOUNTER — Encounter: Payer: Self-pay | Admitting: Neurology

## 2017-10-08 ENCOUNTER — Telehealth: Payer: Self-pay | Admitting: Neurology

## 2017-10-08 NOTE — Telephone Encounter (Signed)
I called pt. I advised pt that Dr. Vickey Hugerohmeier reviewed their sleep study results and found that pt has severe sleep apnea. Dr. Vickey Hugerohmeier recommends that pt starts cpap. I reviewed PAP compliance expectations with the pt. Pt is agreeable to starting a CPAP. I advised pt that an order will be sent to a DME, [facility], and [facility] will call the pt within about one week after they file with the pt's insurance. [facility] will show the pt how to use the machine, fit for masks, and troubleshoot the CPAP if needed. A follow up appt was made for insurance purposes with Butch PennyMegan Millikan, NP on Oct 31,2019 at 10:00 am. Pt verbalized understanding to arrive 15 minutes early and bring their CPAP. A letter with all of this information in it will be mailed to the pt as a reminder. I verified with the pt that the address we have on file is correct. Pt verbalized understanding of results. Pt had no questions at this time but was encouraged to call back if questions arise.

## 2017-10-08 NOTE — Telephone Encounter (Signed)
-----   Message from Melvyn Novasarmen Dohmeier, MD sent at 10/06/2017  5:14 PM EDT ----- IMPRESSION: Severe Sleep Apnea, consistent of 70% Obstructive and  24.1 % Central Apneas. Moderate Loud Snoring.  No prolonged hypoxemia was noted and neither was Tachy-Bradycardia.   RECOMMENDATION: This degree of sleep apnea needs immediate  attention- I recommend to use an auto-titration CPAP machine ,  heated humidity, interface of choice, and pressure window between  6 and 18 cm water with 3 cm EPR.

## 2017-10-09 ENCOUNTER — Other Ambulatory Visit: Payer: Self-pay | Admitting: Family Medicine

## 2017-10-19 ENCOUNTER — Other Ambulatory Visit: Payer: Self-pay | Admitting: Family Medicine

## 2017-10-31 ENCOUNTER — Ambulatory Visit: Payer: Self-pay

## 2017-10-31 NOTE — Telephone Encounter (Signed)
Pt. Reports he started using his CPAP machine last Wednesday and has noticed his BP readings coming down. States today he feels "a little weak." Yesterday BP 96/66/  and 101/69. Today at 0900 122/74 and at noon 102/66. Pulse runs consistently in the 80's. No chest pain or shortness of breath. Feels like his BP medications need to be changed.Please advise pt.  Answer Assessment - Initial Assessment Questions 1. BLOOD PRESSURE: "What is the blood pressure?" "Did you take at least two measurements 5 minutes apart?"     102/66 2. ONSET: "When did you take your blood pressure?"     12 Noon 3. HOW: "How did you obtain the blood pressure?" (e.g., visiting nurse, automatic home BP monitor)     Home BP monitor 4. HISTORY: "Do you have a history of low blood pressure?" "What is your blood pressure normally?"     Yes 5. MEDICATIONS: "Are you taking any medications for blood pressure?" If yes: "Have they been changed recently?"     Just took Hydralizine 6. PULSE RATE: "Do you know what your pulse rate is?"      80 7. OTHER SYMPTOMS: "Have you been sick recently?" "Have you had a recent injury?"     No 8. PREGNANCY: "Is there any chance you are pregnant?" "When was your last menstrual period?"     n/a  Protocols used: LOW BLOOD PRESSURE-A-AH

## 2017-10-31 NOTE — Telephone Encounter (Signed)
Have him f/u with me here to check bp in office.

## 2017-10-31 NOTE — Telephone Encounter (Signed)
Need advise please

## 2017-11-03 NOTE — Telephone Encounter (Signed)
Patient has an appointment for a BP check on 8/27.

## 2017-11-04 ENCOUNTER — Ambulatory Visit (INDEPENDENT_AMBULATORY_CARE_PROVIDER_SITE_OTHER): Payer: BLUE CROSS/BLUE SHIELD | Admitting: Family Medicine

## 2017-11-04 ENCOUNTER — Encounter: Payer: Self-pay | Admitting: Family Medicine

## 2017-11-04 VITALS — BP 138/90 | HR 83 | Temp 97.5°F | Ht 69.0 in | Wt 274.3 lb

## 2017-11-04 DIAGNOSIS — I10 Essential (primary) hypertension: Secondary | ICD-10-CM

## 2017-11-04 NOTE — Assessment & Plan Note (Signed)
BP is well controlled today. If continues to have symptomatic low readings, he will decrease hydralazine to q8 hours and reduce amlodipine to 5mg  daily. Encouraged to follow low salt diet Monitor BP at home Continue CPAP for OSA Keep f/u with me in a few weeks.

## 2017-11-04 NOTE — Progress Notes (Signed)
Colin Garcia - 47 y.o. male MRN [id number]  Date of birth: 12-13-1970  Subjective Chief Complaint  Patient presents with  . Hypertension    would like to discuss BP medicatio, has questions about them.    HPI Colin Garcia is a 47 y.o. male with history of T2DM, HTN and OSA here today for follow up of HTN.  He is concerned about some low blood pressure readings he has had at times at home.  Readings are typically normal but has had some low readings into the 90's systolic.  He has felt dizzy with these readings. He recently started using a cpap and feels that BP has improved since starting to use this.  He admits he could continue to work on his diet.  He denies chest pain, shortness of breath, palpitations, headache, or vision change.   ROS:  A comprehensive ROS was completed and negative except as noted per HPI  No Known Allergies  Past Medical History:  Diagnosis Date  . Diabetes mellitus without complication (HCC)   . Hypertension     No past surgical history on file.  Social History   Socioeconomic History  . Marital status: Single    Spouse name: Not on file  . Number of children: Not on file  . Years of education: Not on file  . Highest education level: Not on file  Occupational History  . Not on file  Social Needs  . Financial resource strain: Not on file  . Food insecurity:    Worry: Not on file    Inability: Not on file  . Transportation needs:    Medical: Not on file    Non-medical: Not on file  Tobacco Use  . Smoking status: Former Smoker    Last attempt to quit: 2008    Years since quitting: 11.6  . Smokeless tobacco: Never Used  Substance and Sexual Activity  . Alcohol use: No  . Drug use: Yes    Types: Marijuana    Comment: Daily  . Sexual activity: Not on file  Lifestyle  . Physical activity:    Days per week: Not on file    Minutes per session: Not on file  . Stress: Not on file  Relationships  . Social connections:    Talks on phone:  Not on file    Gets together: Not on file    Attends religious service: Not on file    Active member of club or organization: Not on file    Attends meetings of clubs or organizations: Not on file    Relationship status: Not on file  Other Topics Concern  . Not on file  Social History Narrative  . Not on file    Family History  Problem Relation Age of Onset  . Hypertension Father   . Diabetes Father     Health Maintenance  Topic Date Due  . HIV Screening  11/28/1986  . TETANUS/TDAP  11/28/1990  . INFLUENZA VACCINE  10/09/2017    ----------------------------------------------------------------------------------------------------------------------------------------------------------------------------------------------------------------- Physical Exam BP 138/90 (BP Location: Left Arm, Patient Position: Sitting, Cuff Size: Large)   Pulse 83   Temp (!) 97.5 F (36.4 C) (Oral)   Ht 5\' 9"  (1.753 m)   Wt 274 lb 4.8 oz (124.4 kg)   SpO2 97%   BMI 40.51 kg/m   Physical Exam  Constitutional: He is oriented to person, place, and time. He appears well-nourished. No distress.  HENT:  Head: Normocephalic and atraumatic.  Eyes: No scleral icterus.  Cardiovascular: Normal rate, regular rhythm and normal heart sounds.  Pulmonary/Chest: Effort normal and breath sounds normal.  Musculoskeletal: He exhibits no edema.  Neurological: He is alert and oriented to person, place, and time.  Skin: Skin is warm and dry. No rash noted.    ------------------------------------------------------------------------------------------------------------------------------------------------------------------------------------------------------------------- Assessment and Plan  Essential hypertension BP is well controlled today. If continues to have symptomatic low readings, he will decrease hydralazine to q8 hours and reduce amlodipine to 5mg  daily. Encouraged to follow low salt diet Monitor BP at  home Continue CPAP for OSA Keep f/u with me in a few weeks.

## 2017-11-04 NOTE — Patient Instructions (Signed)
If you have low blood pressure readings, reduce hydralazine to every 8 hours.  If this continues reduce your amlodipine to 1 5mg  tablet daily. Continue using your CPAP consistently.  Keep your upcoming appointment with me in a few weeks

## 2017-11-28 ENCOUNTER — Ambulatory Visit (INDEPENDENT_AMBULATORY_CARE_PROVIDER_SITE_OTHER): Payer: BLUE CROSS/BLUE SHIELD | Admitting: Family Medicine

## 2017-11-28 ENCOUNTER — Encounter: Payer: Self-pay | Admitting: Family Medicine

## 2017-11-28 VITALS — BP 122/82 | HR 92 | Temp 97.6°F | Ht 69.0 in | Wt 268.2 lb

## 2017-11-28 DIAGNOSIS — E119 Type 2 diabetes mellitus without complications: Secondary | ICD-10-CM | POA: Diagnosis not present

## 2017-11-28 DIAGNOSIS — I10 Essential (primary) hypertension: Secondary | ICD-10-CM | POA: Diagnosis not present

## 2017-11-28 DIAGNOSIS — G4733 Obstructive sleep apnea (adult) (pediatric): Secondary | ICD-10-CM

## 2017-11-28 LAB — BASIC METABOLIC PANEL
BUN: 24 mg/dL — ABNORMAL HIGH (ref 6–23)
CHLORIDE: 101 meq/L (ref 96–112)
CO2: 31 meq/L (ref 19–32)
Calcium: 9.6 mg/dL (ref 8.4–10.5)
Creatinine, Ser: 1.36 mg/dL (ref 0.40–1.50)
GFR: 72.55 mL/min (ref >60.00)
Glucose, Bld: 103 mg/dL — ABNORMAL HIGH (ref 70–99)
POTASSIUM: 3.9 meq/L (ref 3.5–5.1)
SODIUM: 140 meq/L (ref 135–145)

## 2017-11-28 LAB — LIPID PANEL
CHOL/HDL RATIO: 4
Cholesterol: 162 mg/dL (ref 0–200)
HDL: 39 mg/dL — ABNORMAL LOW (ref >39.00)
LDL CALC: 94 mg/dL (ref 0–99)
NonHDL: 122.97
TRIGLYCERIDES: 145 mg/dL (ref 0.0–149.0)
VLDL: 29 mg/dL (ref 0.0–40.0)

## 2017-11-28 LAB — MICROALBUMIN / CREATININE URINE RATIO
Creatinine,U: 128.7 mg/dL
Microalb Creat Ratio: 0.5 mg/g (ref 0.0–30.0)
Microalb, Ur: <0.7 mg/dL (ref 0.0–1.9)

## 2017-11-28 LAB — HEMOGLOBIN A1C: HEMOGLOBIN A1C: 5.9 % (ref 4.6–6.5)

## 2017-11-28 MED ORDER — SILDENAFIL CITRATE 100 MG PO TABS: 50.0000 mg | ORAL_TABLET | Freq: Every day | ORAL | 11 refills | Status: DC | PRN

## 2017-11-28 NOTE — Assessment & Plan Note (Signed)
Doing well with CPAP, recommend continuation.

## 2017-11-28 NOTE — Patient Instructions (Signed)
Continue current medications Continue to monitor BP at home.  I will see you back in about 6 months.

## 2017-11-28 NOTE — Assessment & Plan Note (Signed)
BP is well controlled, continue current medication.  Reminded to follow low salt/DASH diet.  He will continue to monitor at home F/u 6 months.

## 2017-11-28 NOTE — Progress Notes (Signed)
Colin Garcia - 47 y.o. male MRN [id number]  Date of birth: Jul 04, 1970  Subjective Chief Complaint  Patient presents with  . Hypertension    HPI Colin Garcia is a 47 y.o. male with a history of T2DM and HTN here today for follow up of these chronic issues. He also has concern about ED.   -HTN:  Doing well with current medications.  Feels like he has finally found a good regimen of medication and BP has been well controlled at home.  He denies symptoms of hypotension.  He has been following a lower sodium diet.  OSA is being treated with CPAP. He denies anginal symptoms, shortness of breath, palpitations, headache, or vision change.   -T2DM:  Current treatment with metformin, doing well with this. He is not monitoring glucose at home regularly.  He has been trying to eat healthier and lead a more active lifestyle.  He denies polyuria/polydipsia.    ROS:  A comprehensive ROS was completed and negative except as noted per HPI  No Known Allergies  Past Medical History:  Diagnosis Date  . Diabetes mellitus without complication (HCC)   . Hypertension     No past surgical history on file.  Social History   Socioeconomic History  . Marital status: Single    Spouse name: Not on file  . Number of children: Not on file  . Years of education: Not on file  . Highest education level: Not on file  Occupational History  . Not on file  Social Needs  . Financial resource strain: Not on file  . Food insecurity:    Worry: Not on file    Inability: Not on file  . Transportation needs:    Medical: Not on file    Non-medical: Not on file  Tobacco Use  . Smoking status: Former Smoker    Last attempt to quit: 2008    Years since quitting: 11.7  . Smokeless tobacco: Never Used  Substance and Sexual Activity  . Alcohol use: No  . Drug use: Yes    Types: Marijuana    Comment: Daily  . Sexual activity: Not on file  Lifestyle  . Physical activity:    Days per week: Not on file   Minutes per session: Not on file  . Stress: Not on file  Relationships  . Social connections:    Talks on phone: Not on file    Gets together: Not on file    Attends religious service: Not on file    Active member of club or organization: Not on file    Attends meetings of clubs or organizations: Not on file    Relationship status: Not on file  Other Topics Concern  . Not on file  Social History Narrative  . Not on file    Family History  Problem Relation Age of Onset  . Hypertension Father   . Diabetes Father     Health Maintenance  Topic Date Due  . HIV Screening  11/28/1986  . TETANUS/TDAP  11/28/1990  . INFLUENZA VACCINE  11/25/2018 (Originally 10/09/2017)    ----------------------------------------------------------------------------------------------------------------------------------------------------------------------------------------------------------------- Physical Exam BP 122/82   Pulse 92   Temp 97.6 F (36.4 C) (Oral)   Ht 5\' 9"  (1.753 m)   Wt 268 lb 3.2 oz (121.7 kg)   SpO2 98%   BMI 39.61 kg/m   Physical Exam  Constitutional: He is oriented to person, place, and time. He appears well-nourished. No distress.  HENT:  Head: Normocephalic and atraumatic.  Mouth/Throat: Oropharynx is clear and moist.  Eyes: No scleral icterus.  Neck: Neck supple.  Cardiovascular: Normal rate, regular rhythm and normal heart sounds.  Pulmonary/Chest: Effort normal and breath sounds normal.  Neurological: He is alert and oriented to person, place, and time.  Skin: Skin is warm and dry.  Psychiatric: He has a normal mood and affect. His behavior is normal.    ------------------------------------------------------------------------------------------------------------------------------------------------------------------------------------------------------------------- Assessment and Plan  OSA (obstructive sleep apnea) Doing well with CPAP, recommend continuation.    Essential hypertension BP is well controlled, continue current medication.  Reminded to follow low salt/DASH diet.  He will continue to monitor at home F/u 6 months.   Type 2 diabetes mellitus without complication, without long-term current use of insulin (HCC) Doing well with meftormin Update A1c and microalbumin today.

## 2017-11-28 NOTE — Assessment & Plan Note (Signed)
Doing well with meftormin Update A1c and microalbumin today.

## 2017-12-02 NOTE — Progress Notes (Signed)
Labs look good.  -A1c has improved showing good control of blood sugars.   -Cholesterol looks good -Continue current medications.

## 2018-01-05 ENCOUNTER — Other Ambulatory Visit: Payer: Self-pay | Admitting: Family Medicine

## 2018-01-07 ENCOUNTER — Encounter: Payer: Self-pay | Admitting: Adult Health

## 2018-01-08 ENCOUNTER — Encounter: Payer: Self-pay | Admitting: Adult Health

## 2018-01-08 ENCOUNTER — Ambulatory Visit (INDEPENDENT_AMBULATORY_CARE_PROVIDER_SITE_OTHER): Payer: BLUE CROSS/BLUE SHIELD | Admitting: Adult Health

## 2018-01-08 VITALS — BP 127/76 | HR 73 | Ht 69.0 in | Wt 277.0 lb

## 2018-01-08 DIAGNOSIS — G4733 Obstructive sleep apnea (adult) (pediatric): Secondary | ICD-10-CM

## 2018-01-08 DIAGNOSIS — Z9989 Dependence on other enabling machines and devices: Secondary | ICD-10-CM

## 2018-01-08 NOTE — Patient Instructions (Signed)
Your Plan:  Try to use CPAP nightly and >4 hours each night If your symptoms worsen or you develop new symptoms please let us know.   Thank you for coming to see us at [facility]. I hope we have been able to provide you high quality care today.  You may receive a patient satisfaction survey over the next few weeks. We would appreciate your feedback and comments so that we may continue to improve ourselves and the health of our patients.  

## 2018-01-08 NOTE — Progress Notes (Signed)
PATIENT: Colin Garcia DOB: Mar 05, 1971  REASON FOR VISIT: follow up HISTORY FROM: patient  HISTORY OF PRESENT ILLNESS: Today 01/08/18:  Colin Garcia is a 47 year old male with a history of obstructive sleep apnea on CPAP.  He returns today for his first CPAP download.  His download indicates that he uses machine 21 days for compliance of 70%.  On average he uses his machine 4 hours and 10 minutes.  He uses machine greater than 4 hours 66% of the time.  He is on a pressure of 6 to 18 cm of water.  His residual AHI is 2.  He reports that there is some nights that he falls asleep watching TV without put the mask on.  He states that he has noticed the benefit of using CPAP.  His Epworth sleepiness score is 7 and fatigue severity score is 13.  He returns today for evaluation.  HISTORY Colin Garcia is a 47 y.o. male patient ,  seen here  in a referral from Dr. Ashley Royalty with whom he recently established primary care, and followed  for HTN.  He is here to see if he has OSA. He has been snoring for many years, and a friend had reported that  he witnessed apneas, too.   Colin Garcia is a 47 year old gentleman right-handed who has been treated for hypertension on multiple medicines.  His primary care physician coordinates that the patient had been seen at [facility] [place] - at that time critical hypertension.  His systolic blood pressure was 240 mmHg.  In the hospital he was monitored for several days and then started on amlodipine, chlorthalidone, carvedilol, hydrochlorothiazide and  Hydralazine. It remained not well controlled and he may have apnea contributing to these high pressures.  At admission his glucose was fine, but he was told his Hba1c was 6.9. He is habitually consuming high fructose, high glucose and a lot of processed food.  Eats fast food. He is now struggling to change these habits.   Sleep habits are as follows: He  Works second  shift and every other weekend -usually watches TV in his bedroom before he falls asleep, and sleep time is somewhere between 2 and 3 AM.  He states that he can sleep through until the late morning and rises between 8 and 10 AM. He has 2 nocturias. He can go back to sleep.  His bedroom is cool, but not quiet and dark. He prefers to sleep supine- on his back- on a flat bed with 1 or 2 pillows. Estimated sleep duration is 5-6 hours only.  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   Sleep medical history and family sleep history:  Father has OSA, CPAP.  History of one ENT surgery, no neck or head trauma. Sinus draining procedure  several years ago.   Social history:He is habitually consuming high fructose, high glucose and a lot of processed food.  Eats fast food. He is now struggling to change these habits.  History of shift work, used to work third, now second shift. Single, non smoker-no acohol use.  Caffeine - neither coffee, iced tea, nor soda in month.     REVIEW OF SYSTEMS: Out of a complete 14 system review of symptoms, the patient complains only of the following symptoms, and all other reviewed systems are negative.  ALLERGIES: No Known Allergies  HOME MEDICATIONS:  Outpatient Medications Prior to Visit  Medication Sig Dispense Refill  . amLODipine (NORVASC) 5 MG tablet TAKE 2 TABLETS(10 MG) BY MOUTH DAILY 120 tablet 0  . carvedilol (COREG) 12.5 MG tablet TAKE 2 TABLETS BY MOUTH TWICE DAILY WITH MEALS 120 tablet 3  . chlorthalidone (HYGROTON) 25 MG tablet Take 1 tablet (25 mg total) by mouth daily. 90 tablet 1  . hydrALAZINE (APRESOLINE) 10 MG tablet TAKE 1 TABLET(10 MG) BY MOUTH EVERY 6 HOURS 120 tablet 6  . losartan (COZAAR) 50 MG tablet Take 1 tablet (50 mg total) by mouth daily. 90 tablet 1  . metFORMIN (GLUCOPHAGE) 500 MG tablet Take 1 tablet (500 mg total) by mouth 2 (two) times daily  with a meal. 180 tablet 1  . sildenafil (VIAGRA) 100 MG tablet Take 0.5-1 tablets (50-100 mg total) by mouth daily as needed for erectile dysfunction. 10 tablet 11   No facility-administered medications prior to visit.     PAST MEDICAL HISTORY: Past Medical History:  Diagnosis Date  . Diabetes mellitus without complication (HCC)   . Hypertension     PAST SURGICAL HISTORY: History reviewed. No pertinent surgical history.  FAMILY HISTORY: Family History  Problem Relation Age of Onset  . Hypertension Father   . Diabetes Father     SOCIAL HISTORY: Social History   Socioeconomic History  . Marital status: Single    Spouse name: Not on file  . Number of children: Not on file  . Years of education: Not on file  . Highest education level: Not on file  Occupational History  . Not on file  Social Needs  . Financial resource strain: Not on file  . Food insecurity:    Worry: Not on file    Inability: Not on file  . Transportation needs:    Medical: Not on file    Non-medical: Not on file  Tobacco Use  . Smoking status: Former Smoker    Last attempt to quit: 2008    Years since quitting: 11.8  . Smokeless tobacco: Never Used  Substance and Sexual Activity  . Alcohol use: No  . Drug use: Yes    Types: Marijuana    Comment: Daily  . Sexual activity: Not on file  Lifestyle  . Physical activity:    Days per week: Not on file    Minutes per session: Not on file  . Stress: Not on file  Relationships  . Social connections:    Talks on phone: Not on file    Gets together: Not on file    Attends religious service: Not on file    Active member of club or organization: Not on file    Attends meetings of clubs or organizations: Not on file    Relationship status: Not on file  . Intimate partner violence:    Fear of current or ex partner: Not on file    Emotionally abused: Not on file    Physically abused: Not on file    Forced sexual activity: Not on file  Other Topics  Concern  . Not on file  Social History Narrative  . Not on file      PHYSICAL EXAM  Vitals:   01/08/18 1000  BP: 127/76  Pulse: 73  Weight: 277 lb (125.6 kg)  Height: 5\' 9"  (1.753 m)   Body mass index is 40.91 kg/m.  Generalized: Well developed, in no acute distress   Neurological examination  Mentation: Alert oriented to time, place, history taking. Follows  all commands speech and language fluent Cranial nerve II-XII:  Extraocular movements were full, visual field were full on confrontational test. Facial sensation and strength were normal. Uvula tongue midline. Head turning and shoulder shrug  were normal and symmetric.  Neck circumference inches, Mallampati 3+ Motor: The motor testing reveals 5 over 5 strength of all 4 extremities. Good symmetric motor tone is noted throughout.  Sensory: Sensory testing is intact to soft touch on all 4 extremities. No evidence of extinction is noted.   Gait and station: Gait is normal.    DIAGNOSTIC DATA (LABS, IMAGING, TESTING) - I reviewed patient records, labs, notes, testing and imaging myself where available.  Lab Results  Component Value Date   WBC 16.0 (H) 06/23/2012   HGB 15.0 07/29/2012   HCT 44.0 07/29/2012   MCV 82.4 06/23/2012   PLT 315 06/23/2012      Component Value Date/Time   NA 140 11/28/2017 1041   K 3.9 11/28/2017 1041   CL 101 11/28/2017 1041   CO2 31 11/28/2017 1041   GLUCOSE 103 (H) 11/28/2017 1041   BUN 24 (H) 11/28/2017 1041   CREATININE 1.36 11/28/2017 1041   CALCIUM 9.6 11/28/2017 1041   PROT 8.2 07/07/2017 1035   ALBUMIN 4.5 07/07/2017 1035   AST 19 07/07/2017 1035   ALT 21 07/07/2017 1035   ALKPHOS 60 07/07/2017 1035   BILITOT 0.8 07/07/2017 1035   GFRNONAA 82 (L) 06/23/2012 0145   GFRAA >90 06/23/2012 0145   Lab Results  Component Value Date   CHOL 162 11/28/2017   HDL 39.00 (L) 11/28/2017   LDLCALC 94 11/28/2017   TRIG 145.0 11/28/2017   CHOLHDL 4 11/28/2017   Lab Results  Component  Value Date   HGBA1C 5.9 11/28/2017      ASSESSMENT AND PLAN 47 y.o. year old male  has a past medical history of Diabetes mellitus without complication (HCC) and Hypertension. here with:  1.  Obstructive sleep apnea on CPAP  The patient download indicates suboptimal compliance with his CPAP.  He is encouraged to use the CPAP nightly and greater than 4 hours each night.  I have encouraged him to set an alarm at night if he tends to fall asleep before putting the mask on.  We again discussed the risks associated with untreated sleep apnea.  He is advised that if his symptoms worsen or he develops new symptoms he should let us know.  I spent 15 minutes with the patient. 50% of this time was spent reviewing CPAP download   Butch Penny, MSN, NP-C 01/08/2018, 10:10 AM [facility] [address], [address] [place], [place] [address] [phone]

## 2018-01-23 ENCOUNTER — Other Ambulatory Visit: Payer: Self-pay | Admitting: Family Medicine

## 2018-02-24 ENCOUNTER — Other Ambulatory Visit: Payer: Self-pay | Admitting: Family Medicine

## 2018-02-27 ENCOUNTER — Other Ambulatory Visit: Payer: Self-pay | Admitting: Family Medicine

## 2018-02-28 ENCOUNTER — Other Ambulatory Visit: Payer: Self-pay | Admitting: Family Medicine

## 2018-02-28 MED ORDER — AMLODIPINE BESYLATE 5 MG PO TABS: 5.0000 mg | ORAL_TABLET | Freq: Every day | ORAL | 0 refills | Status: DC

## 2018-04-21 ENCOUNTER — Other Ambulatory Visit: Payer: Self-pay | Admitting: Family Medicine

## 2018-04-21 MED ORDER — AMLODIPINE BESYLATE 5 MG PO TABS: 5.0000 mg | ORAL_TABLET | Freq: Every day | ORAL | 0 refills | Status: DC

## 2018-04-21 MED ORDER — METFORMIN HCL 500 MG PO TABS: 500.0000 mg | ORAL_TABLET | Freq: Two times a day (BID) | ORAL | 0 refills | Status: DC

## 2018-05-29 ENCOUNTER — Ambulatory Visit: Payer: BLUE CROSS/BLUE SHIELD | Admitting: Family Medicine

## 2018-06-15 ENCOUNTER — Ambulatory Visit (INDEPENDENT_AMBULATORY_CARE_PROVIDER_SITE_OTHER): Payer: BLUE CROSS/BLUE SHIELD | Admitting: Family Medicine

## 2018-06-15 ENCOUNTER — Encounter: Payer: Self-pay | Admitting: Family Medicine

## 2018-06-15 VITALS — BP 140/88 | HR 80 | Wt 263.0 lb

## 2018-06-15 DIAGNOSIS — I10 Essential (primary) hypertension: Secondary | ICD-10-CM

## 2018-06-15 DIAGNOSIS — Z23 Encounter for immunization: Secondary | ICD-10-CM

## 2018-06-15 DIAGNOSIS — E119 Type 2 diabetes mellitus without complications: Secondary | ICD-10-CM | POA: Diagnosis not present

## 2018-06-15 MED ORDER — AMLODIPINE BESYLATE 5 MG PO TABS: 5.0000 mg | ORAL_TABLET | Freq: Every day | ORAL | 2 refills | Status: DC

## 2018-06-15 MED ORDER — CHLORTHALIDONE 25 MG PO TABS: ORAL_TABLET | ORAL | 2 refills | Status: DC

## 2018-06-15 NOTE — Assessment & Plan Note (Signed)
-  Continue metformin -Future orders entered to have updated a1c and bmp, he will stop in over the next couple of weeks to have this completed.  -Discussed pneumovax vaccine.  He will have this completed when he comes in for lab work.

## 2018-06-15 NOTE — Patient Instructions (Signed)

## 2018-06-15 NOTE — Assessment & Plan Note (Signed)
-  Uncontrolled.  -BP running a little high at home, renewals for chlorthalidone and amlodipine sent in.  -Follow low salt diet.  -F/u in 3 months.

## 2018-06-15 NOTE — Progress Notes (Signed)
Colin Garcia - 48 y.o. male MRN [id number]  Date of birth: [birth year]   This visit type was conducted due to national recommendations for restrictions regarding the COVID-19 Pandemic (e.g. social distancing).  This format is felt to be most appropriate for this patient at this time.  All issues noted in this document were discussed and addressed.  No physical exam was performed (except for noted visual exam findings with Video Visits).  I discussed the limitations of evaluation and management by telemedicine and the availability of in person appointments. The patient expressed understanding and agreed to proceed.  I connected with@ on 06/15/18 at 10:30 AM EDT by a video enabled telemedicine application and verified that I am speaking with the correct person using two identifiers.   Patient Location: Home [address]  [place] [address]   Provider location:   [facility]  Chief Complaint  Patient presents with  . Hypertension    6 Mo F/U, Rx "ok", need refills    HPI  Colin Garcia is a 48 y.o. male who presents via audio/video conferencing for a telehealth visit today.  He is following up today for management of HTN and Diabetes.    -HTN:  Reports blood pressures have been fairly stable but running a little high.  Has been out of chlorthalidone and amlodipine.  Denies side effects from medications including symptoms of hypotension.  He has not had chest pain, shortness of breath, palpitations, headache or vision changes.    -DM:  Previous a1c improved to 5.9%.  He remains compliant with metformin.  He denies side effects including GI upset or hypoglycemia.  He is not checking blood sugars at home.     ROS:  A comprehensive ROS was completed and negative except as noted per HPI  Past Medical History:  Diagnosis Date  . Diabetes mellitus without complication (HCC)   . Hypertension     No past surgical history on file.  Family History  Problem Relation Age  of Onset  . Hypertension Father   . Diabetes Father     Social History   Socioeconomic History  . Marital status: Single    Spouse name: Not on file  . Number of children: Not on file  . Years of education: Not on file  . Highest education level: Not on file  Occupational History  . Not on file  Social Needs  . Financial resource strain: Not on file  . Food insecurity:    Worry: Not on file    Inability: Not on file  . Transportation needs:    Medical: Not on file    Non-medical: Not on file  Tobacco Use  . Smoking status: Former Smoker    Last attempt to quit: 2008    Years since quitting: 12.2  . Smokeless tobacco: Never Used  Substance and Sexual Activity  . Alcohol use: No  . Drug use: Yes    Types: Marijuana    Comment: Daily  . Sexual activity: Not on file  Lifestyle  . Physical activity:    Days per week: Not on file    Minutes per session: Not on file  . Stress: Not on file  Relationships  . Social connections:    Talks on phone: Not on file    Gets together: Not on file    Attends religious service: Not on file    Active member of club or organization: Not on file    Attends meetings of clubs or  organizations: Not on file    Relationship status: Not on file  . Intimate partner violence:    Fear of current or ex partner: Not on file    Emotionally abused: Not on file    Physically abused: Not on file    Forced sexual activity: Not on file  Other Topics Concern  . Not on file  Social History Narrative  . Not on file     Current Outpatient Medications:  .  amLODipine (NORVASC) 5 MG tablet, Take 1 tablet (5 mg total) by mouth daily., Disp: 90 tablet, Rfl: 2 .  carvedilol (COREG) 12.5 MG tablet, TAKE 2 TABLETS BY MOUTH TWICE DAILY WITH MEALS, Disp: 120 tablet, Rfl: 3 .  chlorthalidone (HYGROTON) 25 MG tablet, TAKE 1 TABLET(25 MG) BY MOUTH DAILY, Disp: 90 tablet, Rfl: 2 .  hydrALAZINE (APRESOLINE) 10 MG tablet, TAKE 1 TABLET(10 MG) BY MOUTH EVERY 6  HOURS, Disp: 120 tablet, Rfl: 6 .  losartan (COZAAR) 50 MG tablet, Take 1 tablet (50 mg total) by mouth daily., Disp: 90 tablet, Rfl: 1 .  metFORMIN (GLUCOPHAGE) 500 MG tablet, Take 1 tablet (500 mg total) by mouth 2 (two) times daily with a meal., Disp: 180 tablet, Rfl: 0 .  sildenafil (VIAGRA) 100 MG tablet, Take 0.5-1 tablets (50-100 mg total) by mouth daily as needed for erectile dysfunction., Disp: 10 tablet, Rfl: 11  EXAM:  VITALS per patient if applicable: BP 140/88 Comment: Pt took this am at hm  Pulse 80 Comment: taken at hm  Wt 263 lb (119.3 kg) Comment: Pt wt at home scales  BMI 38.84 kg/m   GENERAL: alert, oriented, appears well and in no acute distress  HEENT: atraumatic, conjunttiva clear, no obvious abnormalities on inspection of external nose and ears  NECK: normal movements of the head and neck  LUNGS: on inspection no signs of respiratory distress, breathing rate appears normal, no obvious gross SOB.  CV: no obvious cyanosis  MS: moves all visible extremities without noticeable abnormality  PSYCH/NEURO: pleasant and cooperative, no obvious depression or anxiety, speech and thought processing grossly intact  ASSESSMENT AND PLAN:  Discussed the following assessment and plan:  Essential hypertension -Uncontrolled.  -BP running a little high at home, renewals for chlorthalidone and amlodipine sent in.  -Follow low salt diet.  -F/u in 3 months.   Type 2 diabetes mellitus without complication, without long-term current use of insulin (HCC) -Continue metformin -Future orders entered to have updated a1c and bmp, he will stop in over the next couple of weeks to have this completed.  -Discussed pneumovax vaccine.  He will have this completed when he comes in for lab work.        I discussed the assessment and treatment plan with the patient. The patient was provided an opportunity to ask questions and all were answered. The patient agreed with the plan and  demonstrated an understanding of the instructions.   The patient was advised to call back or seek an in-person evaluation if the symptoms worsen or if the condition fails to improve as anticipated.    Everrett Coombe, DO

## 2018-06-26 ENCOUNTER — Other Ambulatory Visit: Payer: Self-pay | Admitting: Family Medicine

## 2018-06-26 NOTE — Telephone Encounter (Signed)
Pt was just seen via Virtual visit on 06/17/2018, was given 3 refills for requested medication. Pt needs to check with pharmacy.

## 2018-07-04 ENCOUNTER — Other Ambulatory Visit: Payer: Self-pay | Admitting: Family Medicine

## 2018-07-13 ENCOUNTER — Telehealth: Payer: Self-pay | Admitting: Neurology

## 2018-07-13 NOTE — Telephone Encounter (Signed)
Due to current COVID 19 pandemic, our office is severely reducing in office visits until further notice, in order to minimize the risk to our patients and healthcare providers.   Called patient to offer a sooner appt via virtual visit with Dr. Vickey Huger. Patient was last seen for cpap in October of 2019 and was given a 6 month follow up. Patient was on the schedule to see [facility] NP in July. Patient accepted virtual visit and was scheduled for 5/12. Patient verbalized understanding of the [email or web address] process and is aware that he will receive an e-mail with Dr. Oliva Bustard link as well as directions. Patient is aware that he will receive a call from RN as well as front office staff.  Pt understands that although there may be some limitations with this type of visit, we will take all precautions to reduce any security or privacy concerns.  Pt understands that this will be treated like an in office visit and we will file with pt's insurance, and there may be a patient responsible charge related to this service.

## 2018-07-15 ENCOUNTER — Other Ambulatory Visit: Payer: Self-pay | Admitting: Family Medicine

## 2018-07-20 ENCOUNTER — Encounter: Payer: Self-pay | Admitting: Neurology

## 2018-07-20 NOTE — Telephone Encounter (Signed)

## 2018-07-21 ENCOUNTER — Ambulatory Visit (INDEPENDENT_AMBULATORY_CARE_PROVIDER_SITE_OTHER): Payer: BLUE CROSS/BLUE SHIELD | Admitting: Neurology

## 2018-07-21 ENCOUNTER — Other Ambulatory Visit: Payer: Self-pay

## 2018-07-21 ENCOUNTER — Encounter: Payer: Self-pay | Admitting: Neurology

## 2018-07-21 DIAGNOSIS — Z6841 Body Mass Index (BMI) 40.0 and over, adult: Secondary | ICD-10-CM

## 2018-07-21 DIAGNOSIS — F1220 Cannabis dependence, uncomplicated: Secondary | ICD-10-CM | POA: Diagnosis not present

## 2018-07-21 DIAGNOSIS — Z9114 Patient's other noncompliance with medication regimen: Secondary | ICD-10-CM | POA: Diagnosis not present

## 2018-07-21 NOTE — Progress Notes (Signed)
PATIENT: Colin Garcia DOB: [birth year]  REASON FOR VISIT: follow up on CPAP compliance.   PCP Dr Everrett Coombe, DO   HISTORY FROM: patient  Virtual Visit via Video Note  I connected with Colin Garcia on 07/21/18 at 11:00 AM EDT by a video enabled telemedicine application and verified that I am speaking with the correct person using two identifiers.  Location: Patient:at home  Provider: at the home office   I discussed the limitations of evaluation and management by telemedicine and the availability of in person appointments. The patient expressed understanding and agreed to proceed.  History of Present Illness: patient with in the past poorly controlled hypertension, was diagnosed in 2019 with OSA and is seen here by Video visit on 07-11-2018 for CPAP compliance.     Observations/Objective: Colin Garcia has still a self reported BMI of over 42, and was only borderline compliant with CPAP use over the last 6 months over the last 3 months - he stated that his sleep behaviors have changed from insomnia to almost certainly going to bed and falling asleep.   The patient works late shifts, and says that when he comes home from work he eats and gets ready for bed and falls asleep with the TV on before he had a chance to put the CPAP mask on.  He is single, there is no other family member a companion reminding him and his compliance has been down to 9 out of 30 days of CPAP use.  Average user time 1 hour 19 minutes, average pressure device at 9.9 cmH2O there is a residual AHI of only 1.8/h.  It is clear that the CPAP works when used.  He uses a full facemask which does not allow him to read or watch TV as his glasses cannot be worn at the same time.  His machine office pressure between 6 and 18 cmH2O was 2 cm EPR.  There needs to be no adjustment in pressure I will offer him to go through his DME, in his case [facility], and exchange of full facemask for a ResMed N 30 I type or the [place]  view. I am not sure that [facility] can provide supplies with such poor repeatedly documented compliance   Melvyn Novas, MD    HISTORY OF PRESENT ILLNESS:   Colin Garcia is a 48 year old male with a history of obstructive sleep apnea on CPAP.  He returns today for his first CPAP download.  His download indicates that he uses machine 21 days for compliance of 70%.  On average he uses his machine 4 hours and 10 minutes.  He uses machine greater than 4 hours 66% of the time.  He is on a pressure of 6 to 18 cm of water.  His residual AHI is 2.  He reports that there is some nights that he falls asleep watching TV without put the mask on.  He states that he has noticed the benefit of using CPAP.  His Epworth sleepiness score is 7 and fatigue severity score is 13.  He returns today for evaluation.The patient download indicates suboptimal compliance with his CPAP.  He is encouraged to use the CPAP nightly and greater than 4 hours each night.  I have encouraged him to set an alarm at night if he tends to fall asleep before putting the mask on.  We again discussed the risks associated with untreated sleep apnea.  He is advised that if his symptoms worsen or he develops new  symptoms he should let us know. Epworth Sleepiness score endorsed at 6/ 24 points; Fatigue severity score at 27/63 points, BMI: 42.4  STUDY RESULTS:  Total Recording Time: 6 hours 29 minutes; valid test time 5 hours 48 minutes. Total Apnea/Hypopnea Index (AHI):  59.4 /h and RDI:  60.6 /h Average Oxygen Saturation:  94 %; Lowest Oxygen Saturation:  75%  Total Time Oxygen Saturation below 89% was 7.4 minutes  Average Heart Rate:  76 bpm (between 57 and 104 bpm) IMPRESSION: Severe Sleep Apnea, consistent of 70% Obstructive and 24.1 % Central Apneas. Moderate Loud Snoring. No prolonged hypoxemia was noted and neither was Tachy-Bradycardia.   RECOMMENDATION: This degree of sleep apnea needs immediate attention- I recommend to use an  auto-titration CPAP machine , heated humidity, interface of choice, and pressure window between 6 and 18 cm water with 3 cm EPR.  I certify that I have reviewed the raw data recording prior to the issuance of this report in accordance with the standards of the American Academy of Sleep Medicine (AASM). Melvyn Novasarmen Danaly Bari, M.D.   10-06-2017       HISTORY Colin Monslliott Garcia is a 48 y.o. male patient ,  seen here  in a referral from Dr. Ashley RoyaltyMatthews with whom he recently established primary care, and followed  for HTN. He is here to see if he has OSA. He has been snoring for many years, and a friend had reported that  he witnessed apneas, too.   Colin Garcia is a 48 year old gentleman right-handed who has been treated for hypertension on multiple medicines.  His primary care physician coordinates that the patient had been seen at [facility] [place] for accelerated, critical hypertension.  His systolic blood pressure was 240 mmHg.  In the hospital he was monitored for several days and then started on amlodipine, chlorthalidone, carvedilol, hydrochlorothiazide and  Hydralazine. It remained not well controlled and he may have apnea contributing to these high pressures.  At admission his glucose was fine, but he was told his Hba1c was 6.9. He is habitually consuming high fructose, high glucose and a lot of processed food.  Eats fast food. He is now struggling to change these habits.   Sleep habits are as follows: He works second shift and every other weekend -usually watches TV in his bedroom before he falls asleep, and sleep time is somewhere between 2 and 3 AM.  He states that he can sleep through until the late morning and rises between 8 and 10 AM. He has 2 nocturias. He can go back to sleep.  His bedroom is cool, but not quiet and dark. He prefers to sleep supine- on his back- on a flat bed with 1 or 2 pillows. Estimated sleep duration is 5-6 hours only.  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   Sleep medical history and family sleep history:  Father has OSA, CPAP.  History of one ENT surgery, no neck or head trauma. Sinus draining procedure  several years ago.   Social history:He is habitually consuming high fructose, high glucose and a lot of processed food.  Eats fast food. He is now struggling to change these habits.  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 Single, non smoker-no acohol use.   Caffeine - neither coffee, iced tea, nor soda in month.  REVIEW OF SYSTEMS: Out of a complete 14 system review of symptoms, the patient complains only of the following symptoms, and all other reviewed systems are negative.  ALLERGIES: No Known Allergies  HOME MEDICATIONS: Outpatient Medications Prior to Visit  Medication Sig Dispense Refill  . amLODipine (NORVASC) 5 MG tablet Take 1 tablet (5 mg total) by mouth daily. 90 tablet 2  . carvedilol (COREG) 12.5 MG tablet TAKE 2 TABLETS BY MOUTH TWICE DAILY WITH MEALS 120 tablet 3  . chlorthalidone (HYGROTON) 25 MG tablet TAKE 1 TABLET(25 MG) BY MOUTH DAILY 90 tablet 2  . hydrALAZINE (APRESOLINE) 10 MG tablet TAKE 1 TABLET(10 MG) BY MOUTH EVERY 6 HOURS 120 tablet 6  . losartan (COZAAR) 50 MG tablet Take 1 tablet (50 mg total) by mouth daily. 90 tablet 1  . metFORMIN (GLUCOPHAGE) 500 MG tablet Take 1 tablet (500 mg total) by mouth 2 (two) times daily with a meal. 180 tablet 0  . sildenafil (VIAGRA) 100 MG tablet Take 0.5-1 tablets (50-100 mg total) by mouth daily as needed for erectile dysfunction. 10 tablet 11   No facility-administered medications prior to visit.     PAST MEDICAL HISTORY: Past Medical History:  Diagnosis Date  . Diabetes mellitus without complication (HCC)   . Hypertension     PAST SURGICAL HISTORY: No past surgical history on file.  FAMILY HISTORY:  Family History  Problem Relation Age of Onset  . Hypertension Father   . Diabetes Father     SOCIAL HISTORY: Social History   Socioeconomic History  . Marital status: Single    Spouse name: Not on file  . Number of children: Not on file  . Years of education: Not on file  . Highest education level: Not on file  Occupational History  . Not on file  Social Needs  . Financial resource strain: Not on file  . Food insecurity:    Worry: Not on file    Inability: Not on file  . Transportation needs:    Medical: Not on file    Non-medical: Not on file  Tobacco Use  . Smoking status: Former Smoker    Last attempt to quit: 2008    Years since quitting: 12.3  . Smokeless tobacco: Never Used  Substance and Sexual Activity  . Alcohol use: No  . Drug use: Yes    Types: Marijuana    Comment: Daily  . Sexual activity: Not on file  Lifestyle  . Physical activity:    Days per week: Not on file    Minutes per session: Not on file  . Stress: Not on file  Relationships  . Social connections:    Talks on phone: Not on file    Gets together: Not on file    Attends religious service: Not on file    Active member of club or organization: Not on file    Attends meetings of clubs or organizations: Not on file    Relationship status: Not on file  . Intimate partner violence:    Fear of current or ex partner: Not on file    Emotionally abused: Not on file    Physically abused: Not on file    Forced sexual activity: Not on file  Other Topics Concern  . Not on file  Social History Narrative  . Not on file      Observation :  There were no vitals filed for this visit. There is no height or weight on file to calculate BMI.  Generalized: Well developed, in no acute distress   Neurological examination  Mentation: Alert oriented to time, place, history taking. Follows all commands speech and language fluent Cranial nerve: facial strength was normal.   DIAGNOSTIC DATA (LABS,  IMAGING, TESTING) - I reviewed patient records, labs, notes, testing and imaging myself where available.  Lab Results  Component Value Date   WBC 16.0 (H) 06/23/2012   HGB 15.0 07/29/2012   HCT 44.0 07/29/2012   MCV 82.4 06/23/2012   PLT 315 06/23/2012      Component Value Date/Time   NA 140 11/28/2017 1041   K 3.9 11/28/2017 1041   CL 101 11/28/2017 1041   CO2 31 11/28/2017 1041   GLUCOSE 103 (H) 11/28/2017 1041   BUN 24 (H) 11/28/2017 1041   CREATININE 1.36 11/28/2017 1041   CALCIUM 9.6 11/28/2017 1041   PROT 8.2 07/07/2017 1035   ALBUMIN 4.5 07/07/2017 1035   AST 19 07/07/2017 1035   ALT 21 07/07/2017 1035   ALKPHOS 60 07/07/2017 1035   BILITOT 0.8 07/07/2017 1035   GFRNONAA 82 (L) 06/23/2012 0145   GFRAA >90 06/23/2012 0145   Lab Results  Component Value Date   CHOL 162 11/28/2017   HDL 39.00 (L) 11/28/2017   LDLCALC 94 11/28/2017   TRIG 145.0 11/28/2017   CHOLHDL 4 11/28/2017   Lab Results  Component Value Date   HGBA1C 5.9 11/28/2017      Assessment and Plan: poor CPAP compliance in a diabetic, hypertensive, obese OSA 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    Follow Up Instructions: I will ask [facility] to fir him with an N30i mask to allow use of his corrective lenses to read and watch TV while CPAP interface is in place     I discussed the assessment and treatment plan with the patient. The patient was provided an opportunity to ask questions and all were answered. The patient agreed with the plan and demonstrated an understanding of the instructions.   The patient was advised to call back or seek an in-person evaluation if the symptoms worsen or if the condition fails to improve as anticipated.  I provided 12 minutes of non-face-to-face time during this encounter. If the patient's CPAP compliance remains poor by July- August 2020, please d/c from sleep practice.  RV will be with NP;    Melvyn Novas, MD  07/21/2018, 10:55 AM  [facility] [address] [place], [place] [address] [phone]

## 2018-07-21 NOTE — Patient Instructions (Signed)
Last RV in August 2020 with NP unless compliance improves . New mask type ordered through [facility] ,  ResMed N 30 I type .

## 2018-07-29 ENCOUNTER — Other Ambulatory Visit: Payer: Self-pay | Admitting: Family Medicine

## 2018-08-07 ENCOUNTER — Telehealth: Payer: Self-pay

## 2018-08-07 MED ORDER — METFORMIN HCL 500 MG PO TABS: ORAL_TABLET | ORAL | 0 refills | Status: DC

## 2018-08-07 NOTE — Telephone Encounter (Signed)
Fill x30 days but will need labs for future refills.

## 2018-08-07 NOTE — Telephone Encounter (Signed)
Rx QT#60 for 30 days only , note to pharmacy - must complete labs before anymore Rx refills. Task done.

## 2018-08-13 ENCOUNTER — Other Ambulatory Visit: Payer: Self-pay | Admitting: Family Medicine

## 2018-08-17 ENCOUNTER — Other Ambulatory Visit: Payer: Self-pay

## 2018-08-17 DIAGNOSIS — E119 Type 2 diabetes mellitus without complications: Secondary | ICD-10-CM

## 2018-09-10 ENCOUNTER — Ambulatory Visit: Payer: BLUE CROSS/BLUE SHIELD | Admitting: Adult Health

## 2018-10-27 ENCOUNTER — Other Ambulatory Visit: Payer: Self-pay | Admitting: Family Medicine

## 2018-11-18 ENCOUNTER — Ambulatory Visit: Payer: Self-pay | Admitting: Adult Health

## 2018-11-18 ENCOUNTER — Telehealth: Payer: Self-pay

## 2018-11-18 NOTE — Telephone Encounter (Signed)
Patient was a no call/no show for their appointment today.   

## 2018-11-19 ENCOUNTER — Encounter: Payer: Self-pay | Admitting: Adult Health

## 2018-11-27 ENCOUNTER — Other Ambulatory Visit: Payer: Self-pay | Admitting: Family Medicine

## 2018-11-27 MED ORDER — CARVEDILOL 12.5 MG PO TABS: 25.0000 mg | ORAL_TABLET | Freq: Two times a day (BID) | ORAL | 0 refills | Status: DC

## 2018-11-27 MED ORDER — METFORMIN HCL 500 MG PO TABS: ORAL_TABLET | ORAL | 0 refills | Status: DC

## 2018-12-27 ENCOUNTER — Other Ambulatory Visit: Payer: Self-pay | Admitting: Family Medicine

## 2018-12-29 ENCOUNTER — Other Ambulatory Visit: Payer: Self-pay | Admitting: Family Medicine

## 2019-02-12 ENCOUNTER — Ambulatory Visit: Payer: Self-pay

## 2019-02-12 ENCOUNTER — Other Ambulatory Visit: Payer: Self-pay | Admitting: Family Medicine

## 2019-02-12 ENCOUNTER — Telehealth (INDEPENDENT_AMBULATORY_CARE_PROVIDER_SITE_OTHER): Payer: BLUE CROSS/BLUE SHIELD | Admitting: Family Medicine

## 2019-02-12 ENCOUNTER — Encounter: Payer: Self-pay | Admitting: Family Medicine

## 2019-02-12 VITALS — BP 192/73 | HR 110 | Wt 250.0 lb

## 2019-02-12 DIAGNOSIS — E119 Type 2 diabetes mellitus without complications: Secondary | ICD-10-CM | POA: Diagnosis not present

## 2019-02-12 DIAGNOSIS — I10 Essential (primary) hypertension: Secondary | ICD-10-CM

## 2019-02-12 MED ORDER — AMLODIPINE BESYLATE 5 MG PO TABS: 5.0000 mg | ORAL_TABLET | Freq: Every day | ORAL | 2 refills | Status: DC

## 2019-02-12 MED ORDER — CHLORTHALIDONE 25 MG PO TABS: ORAL_TABLET | ORAL | 2 refills | Status: DC

## 2019-02-12 MED ORDER — CARVEDILOL 12.5 MG PO TABS: 25.0000 mg | ORAL_TABLET | Freq: Two times a day (BID) | ORAL | 2 refills | Status: DC

## 2019-02-12 MED ORDER — METFORMIN HCL 500 MG PO TABS: ORAL_TABLET | ORAL | 0 refills | Status: DC

## 2019-02-12 NOTE — Progress Notes (Signed)
Colin Garcia - 48 y.o. male MRN [id number]  Date of birth: Oct 27, 1970   This visit type was conducted due to national recommendations for restrictions regarding the COVID-19 Pandemic (e.g. social distancing).  This format is felt to be most appropriate for this patient at this time.  All issues noted in this document were discussed and addressed.  No physical exam was performed (except for noted visual exam findings with Video Visits).  I discussed the limitations of evaluation and management by telemedicine and the availability of in person appointments. The patient expressed understanding and agreed to proceed.  I connected with@ on 02/12/19 at 10:20 AM EST by a video enabled telemedicine application and verified that I am speaking with the correct person using two identifiers.  Present at visit: Luetta Nutting, DO Rosealee Albee   Patient Location: Home [place] [address]   Provider location:   [place]  Chief Complaint  Patient presents with  . Hypertension    Pt agrees to virtual visit.  He is following up with HTN.     HPI  Colin Garcia is a 48 y.o. male who presents via audio/video conferencing for a telehealth visit today.  He is following up today for HTN and T2DM.  Reports recent exposure to [place] from a client yesterda.  Had negative rapid test yesterday.  Denies symptoms at this time. Plans to repeat test in a few days.    -HTN:  Reports BP has been elevated at home.  Current BP 192/73 on home BP cuff.  Denies any symptoms related to HTN at this time including chest pain, chest tightness, headache or vision changes.  He has been out of amlodipine, chlorthalidone and Coreg for about 4 months now.  He was going to schedule and appt for f/u but was unable to schedule through mychart so he never did.  He has been trying to follow a healthier diet.  His weight is down about 15 lbs since last visit in April.    -T2DM:  Treated with  metformin however he has been out of this medication for several months.  He does not monitor his blood sugar.  He denies increased fatigue, polyuria or polydipsia.    ROS:  A comprehensive ROS was completed and negative except as noted per HPI  Past Medical History:  Diagnosis Date  . Diabetes mellitus without complication ([place])   . Hypertension     No past surgical history on file.  Family History  Problem Relation Age of Onset  . Hypertension Father   . Diabetes Father     Social History   Socioeconomic History  . Marital status: Single    Spouse name: Not on file  . Number of children: Not on file  . Years of education: Not on file  . Highest education level: Not on file  Occupational History  . Not on file  Social Needs  . Financial resource strain: Not on file  . Food insecurity    Worry: Not on file    Inability: Not on file  . Transportation needs    Medical: Not on file    Non-medical: Not on file  Tobacco Use  . Smoking status: Former Smoker    Quit date: 2008    Years since quitting: 12.9  . Smokeless tobacco: Never Used  Substance and Sexual Activity  . Alcohol use: No  . Drug use: Yes    Types: Marijuana    Comment: Daily  .  Sexual activity: Not on file  Lifestyle  . Physical activity    Days per week: Not on file    Minutes per session: Not on file  . Stress: Not on file  Relationships  . Social Musician on phone: Not on file    Gets together: Not on file    Attends religious service: Not on file    Active member of club or organization: Not on file    Attends meetings of clubs or organizations: Not on file    Relationship status: Not on file  . Intimate partner violence    Fear of current or ex partner: Not on file    Emotionally abused: Not on file    Physically abused: Not on file    Forced sexual activity: Not on file  Other Topics Concern  . Not on file  Social History Narrative  . Not on file     Current  Outpatient Medications:  .  amLODipine (NORVASC) 5 MG tablet, Take 1 tablet (5 mg total) by mouth daily., Disp: 90 tablet, Rfl: 2 .  carvedilol (COREG) 12.5 MG tablet, Take 2 tablets (25 mg total) by mouth 2 (two) times daily with a meal., Disp: 120 tablet, Rfl: 2 .  chlorthalidone (HYGROTON) 25 MG tablet, TAKE 1 TABLET(25 MG) BY MOUTH DAILY, Disp: 90 tablet, Rfl: 2 .  hydrALAZINE (APRESOLINE) 10 MG tablet, TAKE 1 TABLET(10 MG) BY MOUTH EVERY 6 HOURS, Disp: 120 tablet, Rfl: 6 .  losartan (COZAAR) 50 MG tablet, TAKE 1 TABLET(50 MG) BY MOUTH DAILY, Disp: 90 tablet, Rfl: 1 .  metFORMIN (GLUCOPHAGE) 500 MG tablet, TAKE 1 TABLET(500 MG) BY MOUTH TWICE DAILY WITH A MEAL, Disp: 60 tablet, Rfl: 0 .  sildenafil (VIAGRA) 100 MG tablet, Take 0.5-1 tablets (50-100 mg total) by mouth daily as needed for erectile dysfunction., Disp: 10 tablet, Rfl: 11  EXAM:  VITALS per patient if applicable: BP (!) 192/73 Comment: Home BP cuff  Pulse (!) 110   Wt 250 lb (113.4 kg)   BMI 36.92 kg/m   GENERAL: alert, oriented, appears well and in no acute distress  HEENT: atraumatic, conjunttiva clear, no obvious abnormalities on inspection of external nose and ears  NECK: normal movements of the head and neck  LUNGS: on inspection no signs of respiratory distress, breathing rate appears normal, no obvious gross SOB, gasping or wheezing  CV: no obvious cyanosis  MS: moves all visible extremities without noticeable abnormality  PSYCH/NEURO: pleasant and cooperative, no obvious depression or anxiety, speech and thought processing grossly intact  ASSESSMENT AND PLAN:  Discussed the following assessment and plan:  Type 2 diabetes mellitus without complication, without long-term current use of insulin (HCC) Discussed he is due for updated lab work. Will have staff reach out and schedule him for lab visit.  Continue lifestyle change/improvements.   Metformin renewed.   Essential hypertension Uncontrolled.   Denies symptoms.  Medications he is out of were renewed.  Due for updated labs.  follow up in 6 weeks.      I discussed the assessment and treatment plan with the patient. The patient was provided an opportunity to ask questions and all were answered. The patient agreed with the plan and demonstrated an understanding of the instructions.   The patient was advised to call back or seek an in-person evaluation if the symptoms worsen or if the condition fails to improve as anticipated.    Everrett Coombe, DO

## 2019-02-12 NOTE — Telephone Encounter (Signed)
Forwarding medication refill request to PCP for review. 

## 2019-02-12 NOTE — Assessment & Plan Note (Signed)
Discussed he is due for updated lab work. Will have staff reach out and schedule him for lab visit.  Continue lifestyle change/improvements.   Metformin renewed.

## 2019-02-12 NOTE — Assessment & Plan Note (Signed)
Uncontrolled.  Denies symptoms.  Medications he is out of were renewed.  Due for updated labs.  follow up in 6 weeks.

## 2019-02-12 NOTE — Telephone Encounter (Signed)
metFORMIN (GLUCOPHAGE) 500 MG tablet     Patient is requesting a refill of this medication.    Pharmacy:  [facility] [place], [place] [phone] (Phone) [phone] (Fax

## 2019-02-12 NOTE — Telephone Encounter (Signed)
Pt. Reports "I have been out of some of my blood pressure medications for months." States he went to [facility] yesterday for a COVID test and his BP was 230/110. Denies any symptoms. Warm transfer to [facility] in the practice for a visit.  Reason for Disposition . [6] Systolic BP  >= 295 OR Diastolic >= 284  AND [1] having NO cardiac or neurologic symptoms  Answer Assessment - Initial Assessment Questions 1. BLOOD PRESSURE: "What is the blood pressure?" "Did you take at least two measurements 5 minutes apart?"     230/110 2. ONSET: "When did you take your blood pressure?"     Yesterday 3. HOW: "How did you obtain the blood pressure?" (e.g., visiting nurse, automatic home BP monitor)     At [facility] 4. HISTORY: "Do you have a history of high blood pressure?"     Yes 5. MEDICATIONS: "Are you taking any medications for blood pressure?" "Have you missed any doses recently?"     Has been out of some his medication for months 6. OTHER SYMPTOMS: "Do you have any symptoms?" (e.g., headache, chest pain, blurred vision, difficulty breathing, weakness)     No 7. PREGNANCY: "Is there any chance you are pregnant?" "When was your last menstrual period?"     n/a  Protocols used: HIGH BLOOD PRESSURE-A-AH

## 2019-02-15 ENCOUNTER — Telehealth: Payer: Self-pay

## 2019-02-15 NOTE — Progress Notes (Signed)
CM-I have left him a message requesting a RTN call to sched lab visit within next 1-2 weeks/created note for chart as well/thx dmf

## 2019-02-15 NOTE — Telephone Encounter (Signed)
PEC-Plz schedule patient for a lab visit in 1-2 weeks per Dr. Matthews/future orders are placed in system/I LM for pt stating to call and schedule this/thx dmf

## 2019-02-16 ENCOUNTER — Ambulatory Visit: Payer: BLUE CROSS/BLUE SHIELD | Admitting: Family Medicine

## 2019-02-19 ENCOUNTER — Telehealth: Payer: Self-pay

## 2019-02-19 NOTE — Telephone Encounter (Signed)
Called pt to schedule lab appt for 1-2wk. Left vm.

## 2019-03-17 ENCOUNTER — Other Ambulatory Visit: Payer: Self-pay

## 2019-03-18 ENCOUNTER — Other Ambulatory Visit (INDEPENDENT_AMBULATORY_CARE_PROVIDER_SITE_OTHER): Payer: 59

## 2019-03-18 DIAGNOSIS — I10 Essential (primary) hypertension: Secondary | ICD-10-CM | POA: Diagnosis not present

## 2019-03-18 DIAGNOSIS — E119 Type 2 diabetes mellitus without complications: Secondary | ICD-10-CM

## 2019-03-18 LAB — COMPREHENSIVE METABOLIC PANEL
ALT: 15 U/L (ref 0–53)
AST: 17 U/L (ref 0–37)
Albumin: 4.3 g/dL (ref 3.5–5.2)
Alkaline Phosphatase: 55 U/L (ref 39–117)
BUN: 20 mg/dL (ref 6–23)
CO2: 29 mEq/L (ref 19–32)
Calcium: 9.4 mg/dL (ref 8.4–10.5)
Chloride: 101 mEq/L (ref 96–112)
Creatinine, Ser: 1.33 mg/dL (ref 0.40–1.50)
GFR: 69.65 mL/min (ref >60.00)
Glucose, Bld: 112 mg/dL — ABNORMAL HIGH (ref 70–99)
Potassium: 3.3 mEq/L — ABNORMAL LOW (ref 3.5–5.1)
Sodium: 138 mEq/L (ref 135–145)
Total Bilirubin: 0.8 mg/dL (ref 0.2–1.2)
Total Protein: 7.2 g/dL (ref 6.0–8.3)

## 2019-03-18 LAB — LIPID PANEL
Cholesterol: 159 mg/dL (ref 0–200)
HDL: 43.4 mg/dL (ref >39.00)
NonHDL: 115.47
Total CHOL/HDL Ratio: 4
Triglycerides: 207 mg/dL — ABNORMAL HIGH (ref 0.0–149.0)
VLDL: 41.4 mg/dL — ABNORMAL HIGH (ref 0.0–40.0)

## 2019-03-18 LAB — MICROALBUMIN / CREATININE URINE RATIO
Creatinine,U: 174.5 mg/dL
Microalb Creat Ratio: 0.4 mg/g (ref 0.0–30.0)
Microalb, Ur: 0.8 mg/dL (ref 0.0–1.9)

## 2019-03-18 LAB — LDL CHOLESTEROL, DIRECT: Direct LDL: 83 mg/dL

## 2019-03-18 LAB — CBC
HCT: 40.6 % (ref 39.0–52.0)
Hemoglobin: 13.7 g/dL (ref 13.0–17.0)
MCHC: 33.7 g/dL (ref 30.0–36.0)
MCV: 84.6 fl (ref 78.0–100.0)
Platelets: 280 10*3/uL (ref 150.0–400.0)
RBC: 4.8 Mil/uL (ref 4.22–5.81)
RDW: 13.7 % (ref 11.5–15.5)
WBC: 9.3 10*3/uL (ref 4.0–10.5)

## 2019-03-18 LAB — HEMOGLOBIN A1C: Hgb A1c MFr Bld: 6.1 % (ref 4.6–6.5)

## 2019-03-25 ENCOUNTER — Encounter: Payer: Self-pay | Admitting: Family Medicine

## 2019-03-25 ENCOUNTER — Telehealth (INDEPENDENT_AMBULATORY_CARE_PROVIDER_SITE_OTHER): Payer: 59 | Admitting: Family Medicine

## 2019-03-25 DIAGNOSIS — I10 Essential (primary) hypertension: Secondary | ICD-10-CM | POA: Diagnosis not present

## 2019-03-25 DIAGNOSIS — E119 Type 2 diabetes mellitus without complications: Secondary | ICD-10-CM | POA: Diagnosis not present

## 2019-03-25 NOTE — Assessment & Plan Note (Signed)
BP is reportedly well controlled at home.  Continue current medications and low salt diet.

## 2019-03-25 NOTE — Progress Notes (Signed)
Colin Garcia - 49 y.o. male MRN [id number]  Date of birth: 09/20/1970   This visit type was conducted due to national recommendations for restrictions regarding the COVID-19 Pandemic (e.g. social distancing).  This format is felt to be most appropriate for this patient at this time.  All issues noted in this document were discussed and addressed.  No physical exam was performed (except for noted visual exam findings with Video Visits).  I discussed the limitations of evaluation and management by telemedicine and the availability of in person appointments. The patient expressed understanding and agreed to proceed.  I connected with@ on 03/25/19 at  9:00 AM EST by a video enabled telemedicine application and verified that I am speaking with the correct person using two identifiers.  Present at visit: Everrett Coombe, DO Cathe Mons   Patient Location: Home [address]  [place] [address]   Provider location:   Home office  Chief Complaint  Patient presents with  . Follow-up    6 week f/u on blood work, no concerns at this time. Patient states that he have checked his BP but do not remember his reading, they have all been within normal limits.     HPI  Colin Garcia is a 49 y.o. male who presents via audio/video conferencing for a telehealth visit today.  He is following up today regarding recent labs for f/u of DM and HTN.  He reports he is doing well.  He is compliant with current medications.  Recent a1c of 6.1%.  Discussed that this is a little higher than last time but still well controlled. He has made adjustments to his diet to lower his carb intake.  His BP has been well controlled at home.  Cholesterol has been well controlled as well.  He is not currently on a statin, he does not want to start additional medication at this time.    ROS:  A comprehensive ROS was completed and negative except as noted per HPI  Past Medical History:  Diagnosis Date  . Diabetes  mellitus without complication (HCC)   . Hypertension     No past surgical history on file.  Family History  Problem Relation Age of Onset  . Hypertension Father   . Diabetes Father     Social History   Socioeconomic History  . Marital status: Single    Spouse name: Not on file  . Number of children: Not on file  . Years of education: Not on file  . Highest education level: Not on file  Occupational History  . Not on file  Tobacco Use  . Smoking status: Former Smoker    Quit date: 2008    Years since quitting: 13.0  . Smokeless tobacco: Never Used  Substance and Sexual Activity  . Alcohol use: No  . Drug use: Yes    Types: Marijuana    Comment: Daily  . Sexual activity: Not on file  Other Topics Concern  . Not on file  Social History Narrative  . Not on file   Social Determinants of Health   Financial Resource Strain:   . Difficulty of Paying Living Expenses: Not on file  Food Insecurity:   . Worried About Programme researcher, broadcasting/film/video in the Last Year: Not on file  . Ran Out of Food in the Last Year: Not on file  Transportation Needs:   . Lack of Transportation (Medical): Not on file  . Lack of Transportation (Non-Medical): Not on file  Physical Activity:   .  Days of Exercise per Week: Not on file  . Minutes of Exercise per Session: Not on file  Stress:   . Feeling of Stress : Not on file  Social Connections:   . Frequency of Communication with Friends and Family: Not on file  . Frequency of Social Gatherings with Friends and Family: Not on file  . Attends Religious Services: Not on file  . Active Member of Clubs or Organizations: Not on file  . Attends Archivist Meetings: Not on file  . Marital Status: Not on file  Intimate Partner Violence:   . Fear of Current or Ex-Partner: Not on file  . Emotionally Abused: Not on file  . Physically Abused: Not on file  . Sexually Abused: Not on file     Current Outpatient Medications:  .  amLODipine (NORVASC)  5 MG tablet, Take 1 tablet (5 mg total) by mouth daily., Disp: 90 tablet, Rfl: 2 .  carvedilol (COREG) 12.5 MG tablet, Take 2 tablets (25 mg total) by mouth 2 (two) times daily with a meal., Disp: 120 tablet, Rfl: 2 .  chlorthalidone (HYGROTON) 25 MG tablet, TAKE 1 TABLET(25 MG) BY MOUTH DAILY, Disp: 90 tablet, Rfl: 2 .  hydrALAZINE (APRESOLINE) 10 MG tablet, TAKE 1 TABLET(10 MG) BY MOUTH EVERY 6 HOURS, Disp: 120 tablet, Rfl: 6 .  losartan (COZAAR) 50 MG tablet, TAKE 1 TABLET(50 MG) BY MOUTH DAILY, Disp: 90 tablet, Rfl: 1 .  metFORMIN (GLUCOPHAGE) 500 MG tablet, TAKE 1 TABLET(500 MG) BY MOUTH TWICE DAILY WITH A MEAL, Disp: 60 tablet, Rfl: 0 .  sildenafil (VIAGRA) 100 MG tablet, Take 0.5-1 tablets (50-100 mg total) by mouth daily as needed for erectile dysfunction., Disp: 10 tablet, Rfl: 11  EXAM:  VITALS per patient if applicable: Temp 98 F (85.8 C) (Tympanic)   Ht 5\' 9"  (1.753 m)   Wt 256 lb (116.1 kg) Comment: per pt  BMI 37.80 kg/m   GENERAL: alert, oriented, appears well and in no acute distress  HEENT: atraumatic, conjunttiva clear, no obvious abnormalities on inspection of external nose and ears  NECK: normal movements of the head and neck  LUNGS: on inspection no signs of respiratory distress, breathing rate appears normal, no obvious gross SOB, gasping or wheezing  CV: no obvious cyanosis  MS: moves all visible extremities without noticeable abnormality  PSYCH/NEURO: pleasant and cooperative, no obvious depression or anxiety, speech and thought processing grossly intact  ASSESSMENT AND PLAN:  Discussed the following assessment and plan:  Type 2 diabetes mellitus without complication, without long-term current use of insulin (HCC) Diabetes remains well  Controlled.  Reviewed low carb diet.  Continue metformin at current dose.  Recent LDL of 83, declines statin. Will continue to monitor for now.   Essential hypertension BP is reportedly well controlled at home.    Continue current medications and low salt diet.      I discussed the assessment and treatment plan with the patient. The patient was provided an opportunity to ask questions and all were answered. The patient agreed with the plan and demonstrated an understanding of the instructions.   The patient was advised to call back or seek an in-person evaluation if the symptoms worsen or if the condition fails to improve as anticipated.    Luetta Nutting, DO

## 2019-03-25 NOTE — Assessment & Plan Note (Signed)
Diabetes remains well  Controlled.  Reviewed low carb diet.  Continue metformin at current dose.  Recent LDL of 83, declines statin. Will continue to monitor for now.

## 2019-03-28 ENCOUNTER — Other Ambulatory Visit: Payer: Self-pay | Admitting: Family Medicine

## 2019-04-29 ENCOUNTER — Other Ambulatory Visit: Payer: Self-pay | Admitting: Family Medicine

## 2019-04-30 ENCOUNTER — Other Ambulatory Visit: Payer: Self-pay | Admitting: Family Medicine

## 2019-05-03 ENCOUNTER — Telehealth: Payer: Self-pay | Admitting: General Practice

## 2019-05-03 DIAGNOSIS — I10 Essential (primary) hypertension: Secondary | ICD-10-CM

## 2019-05-03 MED ORDER — METFORMIN HCL 500 MG PO TABS: 500.0000 mg | ORAL_TABLET | Freq: Two times a day (BID) | ORAL | 1 refills | Status: DC

## 2019-05-03 NOTE — Telephone Encounter (Signed)
Ok to refill 

## 2019-05-03 NOTE — Telephone Encounter (Signed)
Okay with me. Don't think that its expensive but could be wrong. Not sure what suggested follow up is for him. Needs to return before running out of current meds and for recommended follow up.

## 2019-05-03 NOTE — Telephone Encounter (Signed)
Rx sent in today. 

## 2019-05-03 NOTE — Telephone Encounter (Signed)
Patient is calling and stated that Dr. Ashley Royalty had prescribed him Metformin and he needs PA. CB is [phone].

## 2019-05-03 NOTE — Telephone Encounter (Signed)
I spoke with pt to inform him that a PA has been started today.  Pt said that he may can pay out of pocket, if medication is not too costly.  I informed pt that I will send a message for medication to be filled instead of waiting on PA to go through.   Last fill 01/07/19 Last OV 03/25/2019 Please advise.

## 2019-05-03 NOTE — Telephone Encounter (Signed)
Patient called back and stated Hydralazine needs PA also. Patient uses [facility] on [address]. CB is [phone]

## 2019-05-03 NOTE — Telephone Encounter (Signed)
Last OV 03/25/2019 Last fill 03/28/2019  #60/0

## 2019-05-04 ENCOUNTER — Other Ambulatory Visit: Payer: Self-pay | Admitting: Family Medicine

## 2019-05-04 ENCOUNTER — Ambulatory Visit: Payer: 59 | Admitting: Family Medicine

## 2019-05-04 MED ORDER — CARVEDILOL 12.5 MG PO TABS: 25.0000 mg | ORAL_TABLET | Freq: Two times a day (BID) | ORAL | 2 refills | Status: DC

## 2019-05-04 MED ORDER — HYDRALAZINE HCL 10 MG PO TABS: ORAL_TABLET | ORAL | 3 refills | Status: DC

## 2019-05-04 NOTE — Telephone Encounter (Signed)
PT requested a refill on medication. PT declined an appointment due to being seen in January.  carvedilol (COREG) 12.5 MG tablet [[id number]]

## 2019-05-04 NOTE — Addendum Note (Signed)
Addended by: Stan Head on: 05/04/2019 11:29 AM   Modules accepted: Orders

## 2019-05-04 NOTE — Telephone Encounter (Signed)
Pt was requesting for the Hydralazine to be sent in, instead of waiting for the PA to go through.  I see the Metformin has been sent in yesterday.  Please advise.

## 2019-05-04 NOTE — Telephone Encounter (Signed)
Okay.  Done. 

## 2019-07-22 ENCOUNTER — Other Ambulatory Visit: Payer: Self-pay

## 2019-07-22 MED ORDER — SILDENAFIL CITRATE 100 MG PO TABS: 50.0000 mg | ORAL_TABLET | Freq: Every day | ORAL | 0 refills | Status: DC | PRN

## 2019-07-23 ENCOUNTER — Other Ambulatory Visit: Payer: Self-pay | Admitting: Family Medicine

## 2019-07-28 ENCOUNTER — Telehealth: Payer: Self-pay | Admitting: Family Medicine

## 2019-07-28 NOTE — Telephone Encounter (Signed)
Received fax for PA on Sildenafil filled out form received providers signature and faxed to insurance waiting on determination. - CF

## 2019-08-10 ENCOUNTER — Other Ambulatory Visit: Payer: Self-pay

## 2019-08-10 DIAGNOSIS — I10 Essential (primary) hypertension: Secondary | ICD-10-CM

## 2019-08-10 MED ORDER — CARVEDILOL 12.5 MG PO TABS: 25.0000 mg | ORAL_TABLET | Freq: Two times a day (BID) | ORAL | 1 refills | Status: DC

## 2019-08-30 ENCOUNTER — Other Ambulatory Visit: Payer: Self-pay | Admitting: Family Medicine

## 2019-09-20 ENCOUNTER — Ambulatory Visit (INDEPENDENT_AMBULATORY_CARE_PROVIDER_SITE_OTHER): Payer: 59 | Admitting: Family Medicine

## 2019-09-20 ENCOUNTER — Other Ambulatory Visit: Payer: Self-pay

## 2019-09-20 ENCOUNTER — Encounter: Payer: Self-pay | Admitting: Family Medicine

## 2019-09-20 DIAGNOSIS — I10 Essential (primary) hypertension: Secondary | ICD-10-CM

## 2019-09-20 DIAGNOSIS — E119 Type 2 diabetes mellitus without complications: Secondary | ICD-10-CM

## 2019-09-20 MED ORDER — CARVEDILOL 12.5 MG PO TABS: 25.0000 mg | ORAL_TABLET | Freq: Two times a day (BID) | ORAL | 1 refills | Status: DC

## 2019-09-20 MED ORDER — LOSARTAN POTASSIUM 50 MG PO TABS: ORAL_TABLET | ORAL | 2 refills | Status: DC

## 2019-09-20 MED ORDER — METFORMIN HCL 500 MG PO TABS: 500.0000 mg | ORAL_TABLET | Freq: Two times a day (BID) | ORAL | 1 refills | Status: DC

## 2019-09-20 MED ORDER — AMLODIPINE BESYLATE 5 MG PO TABS: 5.0000 mg | ORAL_TABLET | Freq: Every day | ORAL | 2 refills | Status: DC

## 2019-09-20 MED ORDER — CHLORTHALIDONE 25 MG PO TABS: ORAL_TABLET | ORAL | 2 refills | Status: DC

## 2019-09-20 MED ORDER — HYDRALAZINE HCL 10 MG PO TABS: ORAL_TABLET | ORAL | 3 refills | Status: DC

## 2019-09-20 NOTE — Patient Instructions (Addendum)
Nice to see you today! Blood sugars are still looking ok.  Try to cut back on the french fries.  See me again in about 6 months.

## 2019-09-20 NOTE — Assessment & Plan Note (Signed)
Blood pressure is at goal at for age and co-morbidities.  I recommend continuation of current medications.  In addition they were instructed to follow a low sodium diet with regular exercise to help to maintain adequate control of blood pressure.  ? ?

## 2019-09-20 NOTE — Progress Notes (Addendum)
Colin Garcia - 49 y.o. male MRN [id number]  Date of birth: [birth year]  Subjective Chief Complaint  Patient presents with  . Diabetes  . Foot Pain    HPI Colin Garcia is a 49 y.o. male with history of HTN and T2DM here today for follow up visit.   -HTN:  He is on several medications including and amlodipine, carvedilol, chlorthalidone, hydralazine and losartan.  He is tolerating this well.  BP has been pretty well controlled with current medications.  He denies chest pain, shortness of breath, palpitations, headache or vision changes.    -T2DM: Managed with metformin.  He is doing well with this and denies side effects.  He does not monitor blood sugars at home.  Diet has been stable.   ROS:  A comprehensive ROS was completed and negative except as noted per HPI  No Known Allergies  Past Medical History:  Diagnosis Date  . Diabetes mellitus without complication (HCC)   . Hypertension     History reviewed. No pertinent surgical history.  Social History   Socioeconomic History  . Marital status: Single    Spouse name: Not on file  . Number of children: Not on file  . Years of education: Not on file  . Highest education level: Not on file  Occupational History  . Not on file  Tobacco Use  . Smoking status: Former Smoker    Quit date: 2008    Years since quitting: 13.5  . Smokeless tobacco: Never Used  Vaping Use  . Vaping Use: Never used  Substance and Sexual Activity  . Alcohol use: No  . Drug use: Yes    Types: Marijuana    Comment: Daily  . Sexual activity: Not on file  Other Topics Concern  . Not on file  Social History Narrative  . Not on file   Social Determinants of Health   Financial Resource Strain:   . Difficulty of Paying Living Expenses:   Food Insecurity:   . Worried About Programme researcher, broadcasting/film/video in the Last Year:   . Barista in the Last Year:   Transportation Needs:   . Freight forwarder (Medical):   Marland Kitchen Lack of Transportation  (Non-Medical):   Physical Activity:   . Days of Exercise per Week:   . Minutes of Exercise per Session:   Stress:   . Feeling of Stress :   Social Connections:   . Frequency of Communication with Friends and Family:   . Frequency of Social Gatherings with Friends and Family:   . Attends Religious Services:   . Active Member of Clubs or Organizations:   . Attends Banker Meetings:   Marland Kitchen Marital Status:     Family History  Problem Relation Age of Onset  . Hypertension Father   . Diabetes Father     Health Maintenance  Topic Date Due  . Hepatitis C Screening  Never done  . PNEUMOCOCCAL POLYSACCHARIDE VACCINE AGE 78-64 HIGH RISK  Never done  . FOOT EXAM  Never done  . OPHTHALMOLOGY EXAM  Never done  . COVID-19 Vaccine (1) Never done  . HIV Screening  Never done  . TETANUS/TDAP  Never done  . HEMOGLOBIN A1C  09/15/2019  . INFLUENZA VACCINE  10/10/2019     ----------------------------------------------------------------------------------------------------------------------------------------------------------------------------------------------------------------- Physical Exam BP 138/85   Pulse 76   Ht 5' 8.9" (1.75 m)   Wt 251 lb 1.9 oz (113.9 kg)   SpO2 99%   BMI 37.19 kg/m  Physical Exam Constitutional:      Appearance: Normal appearance.  HENT:     Head: Normocephalic and atraumatic.  Eyes:     General: No scleral icterus. Cardiovascular:     Rate and Rhythm: Normal rate and regular rhythm.  Pulmonary:     Effort: Pulmonary effort is normal.     Breath sounds: Normal breath sounds.  Musculoskeletal:     Cervical back: Neck supple.  Neurological:     General: No focal deficit present.     Mental Status: He is alert.  Psychiatric:        Mood and Affect: Mood normal.        Behavior: Behavior normal.      ------------------------------------------------------------------------------------------------------------------------------------------------------------------------------------------------------------------- Assessment and Plan  Essential hypertension Blood pressure is at goal at for age and co-morbidities.  I recommend continuation of current medications.  In addition they were instructed to follow a low sodium diet with regular exercise to help to maintain adequate control of blood pressure.    Type 2 diabetes mellitus without complication, without long-term current use of insulin (HCC) Lab Results  Component Value Date   HGBA1C 6.0 (A) 09/21/2019   Diabetes remains well controlled. Will plan to continue metformin at current dose.     Meds ordered this encounter  Medications  . losartan (COZAAR) 50 MG tablet    Sig: TAKE 1 TABLET(50 MG) BY MOUTH DAILY    Dispense:  90 tablet    Refill:  2  . amLODipine (NORVASC) 5 MG tablet    Sig: Take 1 tablet (5 mg total) by mouth daily.    Dispense:  90 tablet    Refill:  2  . hydrALAZINE (APRESOLINE) 10 MG tablet    Sig: TAKE 1 TABLET(10 MG) BY MOUTH EVERY 6 HOURS    Dispense:  120 tablet    Refill:  3  . metFORMIN (GLUCOPHAGE) 500 MG tablet    Sig: Take 1 tablet (500 mg total) by mouth 2 (two) times daily with a meal.    Dispense:  180 tablet    Refill:  1  . chlorthalidone (HYGROTON) 25 MG tablet    Sig: TAKE 1 TABLET(25 MG) BY MOUTH DAILY    Dispense:  90 tablet    Refill:  2  . carvedilol (COREG) 12.5 MG tablet    Sig: Take 2 tablets (25 mg total) by mouth 2 (two) times daily with a meal.    Dispense:  120 tablet    Refill:  1    Return in about 6 months (around 03/22/2020) for HTN/DM.    This visit occurred during the SARS-CoV-2 public health emergency.  Safety protocols were in place, including screening questions prior to the visit, additional usage of staff PPE, and extensive cleaning of exam room while  observing appropriate contact time as indicated for disinfecting solutions.

## 2019-09-20 NOTE — Assessment & Plan Note (Addendum)
Lab Results  Component Value Date   HGBA1C 6.0 (A) 09/21/2019   Diabetes remains well controlled. Will plan to continue metformin at current dose.

## 2019-09-21 LAB — POCT GLYCOSYLATED HEMOGLOBIN (HGB A1C): Hemoglobin A1C: 6 % — AB (ref 4.0–5.6)

## 2019-09-21 NOTE — Addendum Note (Signed)
Addended by: Ardyth Man on: 09/21/2019 08:12 AM   Modules accepted: Orders

## 2019-10-06 ENCOUNTER — Other Ambulatory Visit: Payer: Self-pay | Admitting: Family Medicine

## 2019-10-06 DIAGNOSIS — I10 Essential (primary) hypertension: Secondary | ICD-10-CM

## 2019-12-07 ENCOUNTER — Other Ambulatory Visit: Payer: Self-pay

## 2019-12-07 DIAGNOSIS — I10 Essential (primary) hypertension: Secondary | ICD-10-CM

## 2019-12-07 MED ORDER — CARVEDILOL 12.5 MG PO TABS: 25.0000 mg | ORAL_TABLET | Freq: Two times a day (BID) | ORAL | 1 refills | Status: DC

## 2020-02-03 ENCOUNTER — Other Ambulatory Visit: Payer: Self-pay | Admitting: Family Medicine

## 2020-02-10 ENCOUNTER — Other Ambulatory Visit: Payer: Self-pay | Admitting: Family Medicine

## 2020-02-10 DIAGNOSIS — I10 Essential (primary) hypertension: Secondary | ICD-10-CM

## 2020-03-12 ENCOUNTER — Other Ambulatory Visit: Payer: Self-pay | Admitting: Family Medicine

## 2020-03-12 DIAGNOSIS — I10 Essential (primary) hypertension: Secondary | ICD-10-CM

## 2020-03-22 ENCOUNTER — Ambulatory Visit: Payer: 59 | Admitting: Family Medicine

## 2020-03-24 ENCOUNTER — Other Ambulatory Visit: Payer: Self-pay | Admitting: Family Medicine

## 2020-03-24 DIAGNOSIS — I10 Essential (primary) hypertension: Secondary | ICD-10-CM

## 2020-03-28 ENCOUNTER — Other Ambulatory Visit: Payer: Self-pay | Admitting: Family Medicine

## 2020-03-28 DIAGNOSIS — I10 Essential (primary) hypertension: Secondary | ICD-10-CM

## 2020-04-12 ENCOUNTER — Other Ambulatory Visit: Payer: Self-pay | Admitting: Family Medicine

## 2020-04-12 DIAGNOSIS — I10 Essential (primary) hypertension: Secondary | ICD-10-CM

## 2020-04-13 ENCOUNTER — Other Ambulatory Visit: Payer: Self-pay | Admitting: Family Medicine

## 2020-04-13 DIAGNOSIS — I10 Essential (primary) hypertension: Secondary | ICD-10-CM

## 2020-04-14 ENCOUNTER — Other Ambulatory Visit: Payer: Self-pay | Admitting: Sports Medicine

## 2020-04-14 ENCOUNTER — Other Ambulatory Visit: Payer: Self-pay

## 2020-04-14 DIAGNOSIS — I10 Essential (primary) hypertension: Secondary | ICD-10-CM

## 2020-04-14 MED ORDER — CARVEDILOL 12.5 MG PO TABS: 25.0000 mg | ORAL_TABLET | Freq: Two times a day (BID) | ORAL | 0 refills | Status: DC

## 2020-04-14 NOTE — Telephone Encounter (Signed)
This patient called the after-hours line, he needed refill on his carvedilol, looks like it was sent in but he would like it sent to a different pharmacy, I refilled it to the [facility] per his request.

## 2020-04-18 ENCOUNTER — Ambulatory Visit: Payer: Self-pay | Admitting: Family Medicine

## 2020-04-27 ENCOUNTER — Telehealth: Payer: Self-pay

## 2020-04-27 ENCOUNTER — Encounter: Payer: Self-pay | Admitting: Family Medicine

## 2020-04-27 ENCOUNTER — Other Ambulatory Visit: Payer: Self-pay

## 2020-04-27 ENCOUNTER — Ambulatory Visit (INDEPENDENT_AMBULATORY_CARE_PROVIDER_SITE_OTHER): Payer: 59 | Admitting: Family Medicine

## 2020-04-27 VITALS — BP 143/82 | HR 85 | Temp 97.8°F | Ht 68.5 in | Wt 256.6 lb

## 2020-04-27 DIAGNOSIS — Z1211 Encounter for screening for malignant neoplasm of colon: Secondary | ICD-10-CM | POA: Diagnosis not present

## 2020-04-27 DIAGNOSIS — Z114 Encounter for screening for human immunodeficiency virus [HIV]: Secondary | ICD-10-CM

## 2020-04-27 DIAGNOSIS — Z1159 Encounter for screening for other viral diseases: Secondary | ICD-10-CM

## 2020-04-27 DIAGNOSIS — I10 Essential (primary) hypertension: Secondary | ICD-10-CM

## 2020-04-27 DIAGNOSIS — Z0000 Encounter for general adult medical examination without abnormal findings: Secondary | ICD-10-CM | POA: Diagnosis not present

## 2020-04-27 DIAGNOSIS — E119 Type 2 diabetes mellitus without complications: Secondary | ICD-10-CM | POA: Diagnosis not present

## 2020-04-27 DIAGNOSIS — Z23 Encounter for immunization: Secondary | ICD-10-CM

## 2020-04-27 MED ORDER — METFORMIN HCL 500 MG PO TABS: 500.0000 mg | ORAL_TABLET | Freq: Two times a day (BID) | ORAL | 2 refills | Status: DC

## 2020-04-27 MED ORDER — LOSARTAN POTASSIUM 50 MG PO TABS: ORAL_TABLET | ORAL | 2 refills | Status: DC

## 2020-04-27 MED ORDER — HYDRALAZINE HCL 10 MG PO TABS: 10.0000 mg | ORAL_TABLET | Freq: Four times a day (QID) | ORAL | 2 refills | Status: DC

## 2020-04-27 MED ORDER — AMLODIPINE BESYLATE 5 MG PO TABS: ORAL_TABLET | ORAL | 2 refills | Status: DC

## 2020-04-27 MED ORDER — CARVEDILOL 25 MG PO TABS: 25.0000 mg | ORAL_TABLET | Freq: Two times a day (BID) | ORAL | 2 refills | Status: DC

## 2020-04-27 MED ORDER — CHLORTHALIDONE 25 MG PO TABS: 25.0000 mg | ORAL_TABLET | Freq: Every day | ORAL | 2 refills | Status: DC

## 2020-04-27 NOTE — Progress Notes (Signed)
Colin Garcia - 50 y.o. male MRN [id number]  Date of birth: Oct 06, 1970  Subjective No chief complaint on file.   HPI Colin Garcia is a 50 y.o. male here today for follow up visit.  He has a history of HTN, T2DM and OSA.    HTN currently managed with amlodipine, carvedilol, chlorthalidone, losartan and hydralazine.  Reports compliance with medications.  He does not monitor BP at home.  He had tried to do better about his sodium intake.  He does not exercise regularly.  He denies chest pain, shortness of breath, palpitations, headache or vision changes.   He has had variable compliance with CPAP therapy.   Diabetes managed with metformin 500mg  daily.  Blood sugars are around 90-130.  Denies side effects related to medication.   ROS:  A comprehensive ROS was completed and negative except as noted per HPI   No Known Allergies  Past Medical History:  Diagnosis Date  . Diabetes mellitus without complication (HCC)   . Hypertension     No past surgical history on file.  Social History   Socioeconomic History  . Marital status: Single    Spouse name: Not on file  . Number of children: Not on file  . Years of education: Not on file  . Highest education level: Not on file  Occupational History  . Not on file  Tobacco Use  . Smoking status: Former Smoker    Quit date: 2008    Years since quitting: 14.1  . Smokeless tobacco: Never Used  Vaping Use  . Vaping Use: Never used  Substance and Sexual Activity  . Alcohol use: No  . Drug use: Yes    Types: Marijuana    Comment: Daily  . Sexual activity: Not on file  Other Topics Concern  . Not on file  Social History Narrative  . Not on file   Social Determinants of Health   Financial Resource Strain: Not on file  Food Insecurity: Not on file  Transportation Needs: Not on file  Physical Activity: Not on file  Stress: Not on file  Social Connections: Not on file    Family History  Problem Relation Age of Onset  .  Hypertension Father   . Diabetes Father     Health Maintenance  Topic Date Due  . Hepatitis C Screening  Never done  . PNEUMOCOCCAL POLYSACCHARIDE VACCINE AGE 70-64 HIGH RISK  Never done  . COVID-19 Vaccine (1) Never done  . FOOT EXAM  Never done  . OPHTHALMOLOGY EXAM  Never done  . HIV Screening  Never done  . TETANUS/TDAP  Never done  . COLONOSCOPY (Pts 45-14yrs Insurance coverage will need to be confirmed)  Never done  . INFLUENZA VACCINE  Never done  . HEMOGLOBIN A1C  03/23/2020     ----------------------------------------------------------------------------------------------------------------------------------------------------------------------------------------------------------------- Physical Exam BP (!) 143/82 (BP Location: Left Arm, Patient Position: Sitting, Cuff Size: Large)   Pulse 85   Temp 97.8 F (36.6 C)   Ht 5' 8.5" (1.74 m)   Wt 256 lb 9.6 oz (116.4 kg)   SpO2 100%   BMI 38.44 kg/m   Physical Exam Constitutional:      Appearance: Normal appearance.  Eyes:     General: No scleral icterus. Cardiovascular:     Rate and Rhythm: Normal rate and regular rhythm.  Pulmonary:     Effort: Pulmonary effort is normal.     Breath sounds: Normal breath sounds.  Musculoskeletal:     Cervical back: Neck supple.  Neurological:     General: No focal deficit present.     Mental Status: He is alert.  Psychiatric:        Mood and Affect: Mood normal.        Behavior: Behavior normal.     ------------------------------------------------------------------------------------------------------------------------------------------------------------------------------------------------------------------- Assessment and Plan  Essential hypertension BP is slightly elevated today.  Continue current medication Discussed importance of CPAP compliance.   Reminded to follow a low salt diet   Type 2 diabetes mellitus without complication, without long-term current use of  insulin (HCC) Blood sugars at home well controlled.  Update A1c today.  Follow low carbohydrate diet.   Colon cancer screening Cologuard ordered.   Need for hepatitis C screening test Hep C screening ordered.    Meds ordered this encounter  Medications  . DISCONTD: amLODipine (NORVASC) 5 MG tablet    Sig: TAKE 1 TABLET(5 MG) BY MOUTH DAILY    Dispense:  90 tablet    Refill:  2  . DISCONTD: carvedilol (COREG) 25 MG tablet    Sig: Take 1 tablet (25 mg total) by mouth 2 (two) times daily with a meal.    Dispense:  180 tablet    Refill:  2  . DISCONTD: chlorthalidone (HYGROTON) 25 MG tablet    Sig: Take 1 tablet (25 mg total) by mouth daily.    Dispense:  90 tablet    Refill:  2  . DISCONTD: hydrALAZINE (APRESOLINE) 10 MG tablet    Sig: Take 1 tablet (10 mg total) by mouth 4 (four) times daily.    Dispense:  360 tablet    Refill:  2  . DISCONTD: losartan (COZAAR) 50 MG tablet    Sig: TAKE 1 TABLET(50 MG) BY MOUTH DAILY    Dispense:  90 tablet    Refill:  2  . DISCONTD: metFORMIN (GLUCOPHAGE) 500 MG tablet    Sig: Take 1 tablet (500 mg total) by mouth 2 (two) times daily with a meal.    Dispense:  180 tablet    Refill:  2    No follow-ups on file.    This visit occurred during the SARS-CoV-2 public health emergency.  Safety protocols were in place, including screening questions prior to the visit, additional usage of staff PPE, and extensive cleaning of exam room while observing appropriate contact time as indicated for disinfecting solutions.

## 2020-04-27 NOTE — Telephone Encounter (Signed)
Faxed Cologuard with demo sheet and insurance cards

## 2020-04-27 NOTE — Patient Instructions (Signed)
Diabetes Mellitus and Nutrition, Adult When you have diabetes, or diabetes mellitus, it is very important to have healthy eating habits because your blood sugar (glucose) levels are greatly affected by what you eat and drink. Eating healthy foods in the right amounts, at about the same times every day, can help you:  Control your blood glucose.  Lower your risk of heart disease.  Improve your blood pressure.  Reach or maintain a healthy weight. What can affect my meal plan? Every person with diabetes is different, and each person has different needs for a meal plan. Your health care provider may recommend that you work with a dietitian to make a meal plan that is best for you. Your meal plan may vary depending on factors such as:  The calories you need.  The medicines you take.  Your weight.  Your blood glucose, blood pressure, and cholesterol levels.  Your activity level.  Other health conditions you have, such as heart or kidney disease. How do carbohydrates affect me? Carbohydrates, also called carbs, affect your blood glucose level more than any other type of food. Eating carbs naturally raises the amount of glucose in your blood. Carb counting is a method for keeping track of how many carbs you eat. Counting carbs is important to keep your blood glucose at a healthy level, especially if you use insulin or take certain oral diabetes medicines. It is important to know how many carbs you can safely have in each meal. This is different for every person. Your dietitian can help you calculate how many carbs you should have at each meal and for each snack. How does alcohol affect me? Alcohol can cause a sudden decrease in blood glucose (hypoglycemia), especially if you use insulin or take certain oral diabetes medicines. Hypoglycemia can be a life-threatening condition. Symptoms of hypoglycemia, such as sleepiness, dizziness, and confusion, are similar to symptoms of having too much  alcohol.  Do not drink alcohol if: ? Your health care provider tells you not to drink. ? You are pregnant, may be pregnant, or are planning to become pregnant.  If you drink alcohol: ? Do not drink on an empty stomach. ? Limit how much you use to:  0-1 drink a day for women.  0-2 drinks a day for men. ? Be aware of how much alcohol is in your drink. In the [place] one drink equals one 12 oz bottle of beer (355 mL), one 5 oz glass of wine (148 mL), or one 1 oz glass of hard liquor (44 mL). ? Keep yourself hydrated with water, diet soda, or unsweetened iced tea.  Keep in mind that regular soda, juice, and other mixers may contain a lot of sugar and must be counted as carbs. What are tips for following this plan? Reading food labels  Start by checking the serving size on the "Nutrition Facts" label of packaged foods and drinks. The amount of calories, carbs, fats, and other nutrients listed on the label is based on one serving of the item. Many items contain more than one serving per package.  Check the total grams (g) of carbs in one serving. You can calculate the number of servings of carbs in one serving by dividing the total carbs by 15. For example, if a food has 30 g of total carbs per serving, it would be equal to 2 servings of carbs.  Check the number of grams (g) of saturated fats and trans fats in one serving. Choose foods that have   a low amount or none of these fats.  Check the number of milligrams (mg) of salt (sodium) in one serving. Most people should limit total sodium intake to less than 2,300 mg per day.  Always check the nutrition information of foods labeled as "low-fat" or "nonfat." These foods may be higher in added sugar or refined carbs and should be avoided.  Talk to your dietitian to identify your daily goals for nutrients listed on the label. Shopping  Avoid buying canned, pre-made, or processed foods. These foods tend to be high in fat, sodium, and added  sugar.  Shop around the outside edge of the grocery store. This is where you will most often find fresh fruits and vegetables, bulk grains, fresh meats, and fresh dairy. Cooking  Use low-heat cooking methods, such as baking, instead of high-heat cooking methods like deep frying.  Cook using healthy oils, such as olive, canola, or sunflower oil.  Avoid cooking with butter, cream, or high-fat meats. Meal planning  Eat meals and snacks regularly, preferably at the same times every day. Avoid going long periods of time without eating.  Eat foods that are high in fiber, such as fresh fruits, vegetables, beans, and whole grains. Talk with your dietitian about how many servings of carbs you can eat at each meal.  Eat 4-6 oz (112-168 g) of lean protein each day, such as lean meat, chicken, fish, eggs, or tofu. One ounce (oz) of lean protein is equal to: ? 1 oz (28 g) of meat, chicken, or fish. ? 1 egg. ?  cup (62 g) of tofu.  Eat some foods each day that contain healthy fats, such as avocado, nuts, seeds, and fish.   What foods should I eat? Fruits Berries. Apples. Oranges. Peaches. Apricots. Plums. Grapes. Mango. Papaya. Pomegranate. Kiwi. Cherries. Vegetables Lettuce. Spinach. Leafy greens, including kale, chard, collard greens, and mustard greens. Beets. Cauliflower. Cabbage. Broccoli. Carrots. Green beans. Tomatoes. Peppers. Onions. Cucumbers. Brussels sprouts. Grains Whole grains, such as whole-wheat or whole-grain bread, crackers, tortillas, cereal, and pasta. Unsweetened oatmeal. Quinoa. Brown or wild rice. Meats and other proteins Seafood. Poultry without skin. Lean cuts of poultry and beef. Tofu. Nuts. Seeds. Dairy Low-fat or fat-free dairy products such as milk, yogurt, and cheese. The items listed above may not be a complete list of foods and beverages you can eat. Contact a dietitian for more information. What foods should I avoid? Fruits Fruits canned with  syrup. Vegetables Canned vegetables. Frozen vegetables with butter or cream sauce. Grains Refined white flour and flour products such as bread, pasta, snack foods, and cereals. Avoid all processed foods. Meats and other proteins Fatty cuts of meat. Poultry with skin. Breaded or fried meats. Processed meat. Avoid saturated fats. Dairy Full-fat yogurt, cheese, or milk. Beverages Sweetened drinks, such as soda or iced tea. The items listed above may not be a complete list of foods and beverages you should avoid. Contact a dietitian for more information. Questions to ask a health care provider  Do I need to meet with a diabetes educator?  Do I need to meet with a dietitian?  What number can I call if I have questions?  When are the best times to check my blood glucose? Where to find more information:  American Diabetes Association: diabetes.org  Academy of Nutrition and Dietetics: www.eatright.org  National Institute of Diabetes and Digestive and Kidney Diseases: www.niddk.nih.gov  Association of Diabetes Care and Education Specialists: www.diabeteseducator.org Summary  It is important to have healthy eating   habits because your blood sugar (glucose) levels are greatly affected by what you eat and drink.  A healthy meal plan will help you control your blood glucose and maintain a healthy lifestyle.  Your health care provider may recommend that you work with a dietitian to make a meal plan that is best for you.  Keep in mind that carbohydrates (carbs) and alcohol have immediate effects on your blood glucose levels. It is important to count carbs and to use alcohol carefully. This information is not intended to replace advice given to you by your health care provider. Make sure you discuss any questions you have with your health care provider. Document Revised: 02/02/2019 Document Reviewed: 02/02/2019 Elsevier Patient Education  2021 Elsevier Inc.  

## 2020-04-28 LAB — CBC
HCT: 38.7 % (ref 38.5–50.0)
Hemoglobin: 13.3 g/dL (ref 13.2–17.1)
MCH: 28.9 pg (ref 27.0–33.0)
MCHC: 34.4 g/dL (ref 32.0–36.0)
MCV: 84.1 fL (ref 80.0–100.0)
MPV: 9.7 fL (ref 7.5–12.5)
Platelets: 285 10*3/uL (ref 140–400)
RBC: 4.6 10*6/uL (ref 4.20–5.80)
RDW: 13.3 % (ref 11.0–15.0)
WBC: 8.6 10*3/uL (ref 3.8–10.8)

## 2020-04-28 LAB — COMPLETE METABOLIC PANEL WITH GFR
AG Ratio: 1.5 (calc) (ref 1.0–2.5)
ALT: 15 U/L (ref 9–46)
AST: 15 U/L (ref 10–40)
Albumin: 4.4 g/dL (ref 3.6–5.1)
Alkaline phosphatase (APISO): 49 U/L (ref 36–130)
BUN: 23 mg/dL (ref 7–25)
CO2: 28 mmol/L (ref 20–32)
Calcium: 9.5 mg/dL (ref 8.6–10.3)
Chloride: 101 mmol/L (ref 98–110)
Creat: 1.35 mg/dL (ref 0.60–1.35)
GFR, Est African American: 71 mL/min/{1.73_m2} (ref >=60)
GFR, Est Non African American: 62 mL/min/{1.73_m2} (ref >=60)
Globulin: 3 g/dL (calc) (ref 1.9–3.7)
Glucose, Bld: 90 mg/dL (ref 65–99)
Potassium: 3.4 mmol/L — ABNORMAL LOW (ref 3.5–5.3)
Sodium: 138 mmol/L (ref 135–146)
Total Bilirubin: 0.9 mg/dL (ref 0.2–1.2)
Total Protein: 7.4 g/dL (ref 6.1–8.1)

## 2020-04-28 LAB — LIPID PANEL
Cholesterol: 153 mg/dL (ref <200)
HDL: 45 mg/dL (ref >=40)
LDL Cholesterol (Calc): 80 mg/dL (calc)
Non-HDL Cholesterol (Calc): 108 mg/dL (calc) (ref <130)
Total CHOL/HDL Ratio: 3.4 (calc) (ref <5.0)
Triglycerides: 186 mg/dL — ABNORMAL HIGH (ref <150)

## 2020-04-28 LAB — HEMOGLOBIN A1C
Hgb A1c MFr Bld: 5.9 % of total Hgb — ABNORMAL HIGH (ref <5.7)
Mean Plasma Glucose: 123 mg/dL
eAG (mmol/L): 6.8 mmol/L

## 2020-04-28 LAB — HEPATITIS C ANTIBODY
Hepatitis C Ab: NONREACTIVE
SIGNAL TO CUT-OFF: 0.01 (ref <1.00)

## 2020-04-28 LAB — HIV ANTIBODY (ROUTINE TESTING W REFLEX): HIV 1&2 Ab, 4th Generation: NONREACTIVE

## 2020-04-30 DIAGNOSIS — Z1211 Encounter for screening for malignant neoplasm of colon: Secondary | ICD-10-CM | POA: Insufficient documentation

## 2020-04-30 DIAGNOSIS — Z1159 Encounter for screening for other viral diseases: Secondary | ICD-10-CM | POA: Insufficient documentation

## 2020-04-30 NOTE — Assessment & Plan Note (Signed)
Cologuard ordered

## 2020-04-30 NOTE — Assessment & Plan Note (Signed)
Hep C screening ordered

## 2020-04-30 NOTE — Assessment & Plan Note (Signed)
BP is slightly elevated today.  Continue current medication Discussed importance of CPAP compliance.   Reminded to follow a low salt diet

## 2020-04-30 NOTE — Assessment & Plan Note (Signed)
Blood sugars at home well controlled.  Update A1c today.  Follow low carbohydrate diet.

## 2020-06-28 ENCOUNTER — Other Ambulatory Visit: Payer: Self-pay | Admitting: Family Medicine

## 2020-06-30 MED ORDER — SILDENAFIL CITRATE 100 MG PO TABS: 50.0000 mg | ORAL_TABLET | Freq: Every day | ORAL | 3 refills | Status: AC | PRN

## 2020-06-30 NOTE — Telephone Encounter (Signed)
Last appt 04/27/2020  Last written 07/22/2019 #15 with no refills

## 2020-10-25 ENCOUNTER — Ambulatory Visit: Payer: Self-pay | Admitting: Family Medicine

## 2020-11-09 ENCOUNTER — Other Ambulatory Visit: Payer: Self-pay | Admitting: Family Medicine

## 2020-11-09 DIAGNOSIS — I10 Essential (primary) hypertension: Secondary | ICD-10-CM

## 2020-11-09 NOTE — Telephone Encounter (Signed)
Made a follow up appt. for  11/15/2020 @ 1:00 tvt

## 2020-11-09 NOTE — Telephone Encounter (Signed)
Please call to schedule pt's 6 month f/u appt.  Sending 30 day refill.   Thanks

## 2020-11-10 ENCOUNTER — Ambulatory Visit: Payer: 59 | Admitting: Family Medicine

## 2020-11-15 ENCOUNTER — Ambulatory Visit: Payer: 59 | Admitting: Family Medicine

## 2020-11-15 DIAGNOSIS — E119 Type 2 diabetes mellitus without complications: Secondary | ICD-10-CM

## 2020-11-15 DIAGNOSIS — I10 Essential (primary) hypertension: Secondary | ICD-10-CM

## 2020-12-04 ENCOUNTER — Ambulatory Visit: Payer: 59 | Admitting: Family Medicine

## 2020-12-04 ENCOUNTER — Telehealth: Payer: Self-pay | Admitting: Family Medicine

## 2020-12-04 NOTE — Telephone Encounter (Signed)
Pt called at 8:30 to cancel appointment because of a stomach issue.

## 2020-12-09 ENCOUNTER — Other Ambulatory Visit: Payer: Self-pay | Admitting: Family Medicine

## 2020-12-09 DIAGNOSIS — I10 Essential (primary) hypertension: Secondary | ICD-10-CM

## 2020-12-10 ENCOUNTER — Other Ambulatory Visit: Payer: Self-pay | Admitting: Family Medicine

## 2020-12-10 DIAGNOSIS — I10 Essential (primary) hypertension: Secondary | ICD-10-CM

## 2020-12-12 MED ORDER — AMLODIPINE BESYLATE 5 MG PO TABS: ORAL_TABLET | ORAL | 0 refills | Status: DC

## 2020-12-12 MED ORDER — CHLORTHALIDONE 25 MG PO TABS: 25.0000 mg | ORAL_TABLET | Freq: Every day | ORAL | 0 refills | Status: DC

## 2020-12-13 ENCOUNTER — Other Ambulatory Visit: Payer: Self-pay

## 2020-12-13 ENCOUNTER — Ambulatory Visit (INDEPENDENT_AMBULATORY_CARE_PROVIDER_SITE_OTHER): Payer: 59 | Admitting: Family Medicine

## 2020-12-13 ENCOUNTER — Encounter: Payer: Self-pay | Admitting: Family Medicine

## 2020-12-13 DIAGNOSIS — I10 Essential (primary) hypertension: Secondary | ICD-10-CM

## 2020-12-13 DIAGNOSIS — E119 Type 2 diabetes mellitus without complications: Secondary | ICD-10-CM

## 2020-12-13 LAB — POCT GLYCOSYLATED HEMOGLOBIN (HGB A1C): HbA1c, POC (controlled diabetic range): 5.6 % (ref 0.0–7.0)

## 2020-12-13 MED ORDER — CHLORTHALIDONE 25 MG PO TABS: 25.0000 mg | ORAL_TABLET | Freq: Every day | ORAL | 2 refills | Status: DC

## 2020-12-13 MED ORDER — METFORMIN HCL 500 MG PO TABS: 500.0000 mg | ORAL_TABLET | Freq: Two times a day (BID) | ORAL | 2 refills | Status: DC

## 2020-12-13 MED ORDER — LOSARTAN POTASSIUM 50 MG PO TABS: ORAL_TABLET | ORAL | 2 refills | Status: DC

## 2020-12-13 MED ORDER — AMLODIPINE BESYLATE 5 MG PO TABS: ORAL_TABLET | ORAL | 2 refills | Status: DC

## 2020-12-13 MED ORDER — CARVEDILOL 25 MG PO TABS: 25.0000 mg | ORAL_TABLET | Freq: Two times a day (BID) | ORAL | 2 refills | Status: DC

## 2020-12-13 MED ORDER — HYDRALAZINE HCL 10 MG PO TABS: 10.0000 mg | ORAL_TABLET | Freq: Four times a day (QID) | ORAL | 2 refills | Status: DC

## 2020-12-13 NOTE — Progress Notes (Signed)
Colin Garcia - 50 y.o. male MRN [id number]  Date of birth: [birth year]  Subjective Chief Complaint  Patient presents with   Foot Pain   Diabetes   Hypertension    HPI Colin Garcia is a 50 year old male here today for follow-up visit.  He reports that his diet has been fairly poor.  He has avoided higher carbohydrate foods however reports he has been eating a high salt diet and fattier foods.  He is not monitoring blood pressure blood sugar at home.  He has been out of his amlodipine and chlorthalidone for the past couple of weeks.  He just picked this prescription up yesterday but has not taken yet.  He denies any symptoms related to hypertension including chest pain, shortness of breath, palpitations, headache or vision changes.  ROS:  A comprehensive ROS was completed and negative except as noted per HPI  No Known Allergies  Past Medical History:  Diagnosis Date   Diabetes mellitus without complication (HCC)    Hypertension     No past surgical history on file.  Social History   Socioeconomic History   Marital status: Single    Spouse name: Not on file   Number of children: Not on file   Years of education: Not on file   Highest education level: Not on file  Occupational History   Not on file  Tobacco Use   Smoking status: Former    Types: Cigarettes    Quit date: 2008    Years since quitting: 14.7   Smokeless tobacco: Never  Vaping Use   Vaping Use: Never used  Substance and Sexual Activity   Alcohol use: No   Drug use: Yes    Types: Marijuana    Comment: Daily   Sexual activity: Not on file  Other Topics Concern   Not on file  Social History Narrative   Not on file   Social Determinants of Health   Financial Resource Strain: Not on file  Food Insecurity: Not on file  Transportation Needs: Not on file  Physical Activity: Not on file  Stress: Not on file  Social Connections: Not on file    Family History  Problem Relation Age of Onset   Hypertension  Father    Diabetes Father     Health Maintenance  Topic Date Due   COVID-19 Vaccine (1) Never done   OPHTHALMOLOGY EXAM  Never done   COLONOSCOPY (Pts 45-67yrs Insurance coverage will need to be confirmed)  Never done   INFLUENZA VACCINE  Never done   TETANUS/TDAP  04/27/2021 (Originally 11/28/1990)   FOOT EXAM  04/27/2021   HEMOGLOBIN A1C  06/13/2021   Hepatitis C Screening  Completed   HIV Screening  Completed   HPV VACCINES  Aged Out     ----------------------------------------------------------------------------------------------------------------------------------------------------------------------------------------------------------------- Physical Exam There were no vitals taken for this visit.  Physical Exam Constitutional:      Appearance: Normal appearance.  Eyes:     General: No scleral icterus. Cardiovascular:     Rate and Rhythm: Normal rate and regular rhythm.  Pulmonary:     Effort: Pulmonary effort is normal.     Breath sounds: Normal breath sounds.  Musculoskeletal:     Cervical back: Neck supple.  Neurological:     General: No focal deficit present.     Mental Status: He is alert.  Psychiatric:        Mood and Affect: Mood normal.        Behavior: Behavior normal.    -------------------------------------------------------------------------------------------------------------------------------------------------------------------------------------------------------------------  Assessment and Plan  Essential hypertension Blood pressures not well controlled today.  He has been out of some of his medication which she will restart.  Refills provided for medications.  We discussed working on dietary changes including low-sodium diet.  He will return in 1 month for blood pressure recheck.  Type 2 diabetes mellitus without complication, without long-term current use of insulin (HCC) Lab Results  Component Value Date   HGBA1C 5.6 12/13/2020  Diabetes is  well controlled.  Encouraged to follow a low carbohydrate diet.  He will continue metformin at current strength.   Meds ordered this encounter  Medications   carvedilol (COREG) 25 MG tablet    Sig: Take 1 tablet (25 mg total) by mouth 2 (two) times daily with a meal.    Dispense:  180 tablet    Refill:  2   amLODipine (NORVASC) 5 MG tablet    Sig: Take 1 tablet by mouth once daily    Dispense:  90 tablet    Refill:  2   chlorthalidone (HYGROTON) 25 MG tablet    Sig: Take 1 tablet (25 mg total) by mouth daily.    Dispense:  90 tablet    Refill:  2   hydrALAZINE (APRESOLINE) 10 MG tablet    Sig: Take 1 tablet (10 mg total) by mouth 4 (four) times daily.    Dispense:  360 tablet    Refill:  2   losartan (COZAAR) 50 MG tablet    Sig: TAKE 1 TABLET(50 MG) BY MOUTH DAILY    Dispense:  90 tablet    Refill:  2   metFORMIN (GLUCOPHAGE) 500 MG tablet    Sig: Take 1 tablet (500 mg total) by mouth 2 (two) times daily with a meal.    Dispense:  180 tablet    Refill:  2    Return in about 6 months (around 06/13/2021) for HTN.    This visit occurred during the SARS-CoV-2 public health emergency.  Safety protocols were in place, including screening questions prior to the visit, additional usage of staff PPE, and extensive cleaning of exam room while observing appropriate contact time as indicated for disinfecting solutions.

## 2020-12-13 NOTE — Assessment & Plan Note (Signed)
Blood pressures not well controlled today.  He has been out of some of his medication which she will restart.  Refills provided for medications.  We discussed working on dietary changes including low-sodium diet.  He will return in 1 month for blood pressure recheck.

## 2020-12-13 NOTE — Patient Instructions (Signed)
Work on dietary change.  Follow a low sodium diet.  Nurse visit in 1 month for BP  6 month follow up with me.

## 2020-12-13 NOTE — Assessment & Plan Note (Signed)
Lab Results  Component Value Date   HGBA1C 5.6 12/13/2020  Diabetes is well controlled.  Encouraged to follow a low carbohydrate diet.  He will continue metformin at current strength.

## 2020-12-26 ENCOUNTER — Other Ambulatory Visit: Payer: Self-pay | Admitting: Family Medicine

## 2020-12-26 DIAGNOSIS — I10 Essential (primary) hypertension: Secondary | ICD-10-CM

## 2021-01-15 ENCOUNTER — Ambulatory Visit (INDEPENDENT_AMBULATORY_CARE_PROVIDER_SITE_OTHER): Payer: 59 | Admitting: Family Medicine

## 2021-01-15 ENCOUNTER — Other Ambulatory Visit: Payer: Self-pay | Admitting: Family Medicine

## 2021-01-15 ENCOUNTER — Other Ambulatory Visit: Payer: Self-pay

## 2021-01-15 VITALS — BP 141/74 | HR 78 | Temp 97.8°F | Wt 268.1 lb

## 2021-01-15 DIAGNOSIS — I10 Essential (primary) hypertension: Secondary | ICD-10-CM

## 2021-01-15 NOTE — Progress Notes (Signed)
Medical screening examination/treatment was performed by qualified clinical staff member and as supervising physician I was immediately available for consultation/collaboration. I have reviewed documentation and agree with assessment and plan.  De Jaworski, DO  

## 2021-01-15 NOTE — Progress Notes (Signed)
Patient is here for a blood pressure check. Denies chest pain, SOB, palpitations, lightheadedness, dizziness, missed dose, medication problem or trouble sleeping.   Patient blood pressure was not in range at initial check. Patient sat for 15 minutes.   Blood pressure #1 149/88 (81) Blood pressure #2  141/74 (78)  Provider informed of blood pressure readings. Patient is to continue taking current medication plan. Patient advised to schedule next appointment with provider. Per patient, has an future appointment scheduled for 06/13/2021.

## 2021-01-30 ENCOUNTER — Other Ambulatory Visit: Payer: Self-pay | Admitting: Family Medicine

## 2021-06-13 ENCOUNTER — Ambulatory Visit: Payer: 59 | Admitting: Family Medicine

## 2021-06-21 ENCOUNTER — Ambulatory Visit (INDEPENDENT_AMBULATORY_CARE_PROVIDER_SITE_OTHER): Payer: Self-pay | Admitting: Family Medicine

## 2021-06-21 ENCOUNTER — Encounter: Payer: Self-pay | Admitting: Family Medicine

## 2021-06-21 VITALS — BP 125/82 | HR 94 | Ht 68.5 in | Wt 269.0 lb

## 2021-06-21 DIAGNOSIS — Z23 Encounter for immunization: Secondary | ICD-10-CM

## 2021-06-21 DIAGNOSIS — Z1211 Encounter for screening for malignant neoplasm of colon: Secondary | ICD-10-CM

## 2021-06-21 DIAGNOSIS — I10 Essential (primary) hypertension: Secondary | ICD-10-CM

## 2021-06-21 DIAGNOSIS — E119 Type 2 diabetes mellitus without complications: Secondary | ICD-10-CM

## 2021-06-21 NOTE — Assessment & Plan Note (Signed)
Updated a1c ordered today.  Has done well metformin.   ?

## 2021-06-21 NOTE — Patient Instructions (Signed)
Complete COLOGUARD.  ?Continue current medications.  ?We'll be in touch with lab results.  ?

## 2021-06-21 NOTE — Progress Notes (Signed)
?Colin Garcia - 51 y.o. male MRN [id number]  Date of birth: January 24, 1971 ? ?Subjective ?Chief Complaint  ?Patient presents with  ? Diabetes  ? ? ?HPI ?Colin Garcia is a 51 y.o. male here today for follow up visit.  Reports that overall he is doing well.  Complains of "lack of motivation."  Denies feeling depressed.  Just doesn't want to do much.   ? ?He continues to do well with medication for management of HTN.  BP is well controlled at this time.  Denies chest pain,shortness of breath, palpitations, headache or vision changes. ? ?Diabetes is managed with metformin.  Tolerating well.  He does not monitor glucose at home regularly.  ? ?ROS:  A comprehensive ROS was completed and negative except as noted per HPI ?  ?No Known Allergies ? ?Past Medical History:  ?Diagnosis Date  ? Diabetes mellitus without complication ([place])   ? Hypertension   ? ? ?History reviewed. No pertinent surgical history. ? ?Social History  ? ?Socioeconomic History  ? Marital status: Single  ?  Spouse name: Not on file  ? Number of children: Not on file  ? Years of education: Not on file  ? Highest education level: Not on file  ?Occupational History  ? Not on file  ?Tobacco Use  ? Smoking status: Former  ?  Types: Cigarettes  ?  Quit date: 2008  ?  Years since quitting: 15.2  ? Smokeless tobacco: Never  ?Vaping Use  ? Vaping Use: Never used  ?Substance and Sexual Activity  ? Alcohol use: No  ? Drug use: Yes  ?  Types: Marijuana  ?  Comment: Daily  ? Sexual activity: Not on file  ?Other Topics Concern  ? Not on file  ?Social History Narrative  ? Not on file  ? ?Social Determinants of Health  ? ?Financial Resource Strain: Not on file  ?Food Insecurity: Not on file  ?Transportation Needs: Not on file  ?Physical Activity: Not on file  ?Stress: Not on file  ?Social Connections: Not on file  ? ? ?Family History  ?Problem Relation Age of Onset  ? Hypertension Father   ? Diabetes Father   ? ? ?Health Maintenance  ?Topic Date Due  ? HEMOGLOBIN A1C   06/13/2021  ? OPHTHALMOLOGY EXAM  06/21/2021 (Originally 11/27/1981)  ? COVID-19 Vaccine (1) 11/09/2021 (Originally [birth year]1972)  ? Fecal DNA (Cologuard)  11/09/2021 (Originally 11/27/2016)  ? INFLUENZA VACCINE  10/09/2021  ? FOOT EXAM  06/22/2022  ? TETANUS/TDAP  06/22/2031  ? Hepatitis C Screening  Completed  ? HIV Screening  Completed  ? HPV VACCINES  Aged Out  ? ? ? ?----------------------------------------------------------------------------------------------------------------------------------------------------------------------------------------------------------------- ?Physical Exam ?BP 125/82 (BP Location: Left Arm, Patient Position: Sitting, Cuff Size: Large)   Pulse 94   Ht 5' 8.5" (1.74 m)   Wt 269 lb (122 kg)   SpO2 98%   BMI 40.31 kg/m?  ? ?Physical Exam ?Constitutional:   ?   Appearance: Normal appearance.  ?Eyes:  ?   General: No scleral icterus. ?Cardiovascular:  ?   Rate and Rhythm: Normal rate and regular rhythm.  ?Pulmonary:  ?   Effort: Pulmonary effort is normal.  ?   Breath sounds: Normal breath sounds.  ?Musculoskeletal:  ?   Cervical back: Neck supple.  ?Neurological:  ?   Mental Status: He is alert.  ?Psychiatric:     ?   Mood and Affect: Mood normal.     ?   Behavior: Behavior normal.  ? ? ?------------------------------------------------------------------------------------------------------------------------------------------------------------------------------------------------------------------- ?  Assessment and Plan ? ?Essential hypertension ?BP is well controlled with current medications,  Recommend continuation.  ? ?Type 2 diabetes mellitus without complication, without long-term current use of insulin ([place]) ?Updated a1c ordered today.  Has done well metformin.   ? ?Colon cancer screening ?Reminded to complete cologuard.  ? ? ?No orders of the defined types were placed in this encounter. ? ? ?No follow-ups on file. ? ? ? ?This visit occurred during the SARS-CoV-2 public health  emergency.  Safety protocols were in place, including screening questions prior to the visit, additional usage of staff PPE, and extensive cleaning of exam room while observing appropriate contact time as indicated for disinfecting solutions.  ? ?

## 2021-06-21 NOTE — Assessment & Plan Note (Signed)
BP is well controlled with current medications,  Recommend continuation.  ?

## 2021-06-21 NOTE — Assessment & Plan Note (Signed)
Reminded to complete cologuard.  ?

## 2021-06-22 LAB — COMPLETE METABOLIC PANEL WITH GFR
AG Ratio: 1.4 (calc) (ref 1.0–2.5)
ALT: 23 U/L (ref 9–46)
AST: 23 U/L (ref 10–40)
Albumin: 4.4 g/dL (ref 3.6–5.1)
Alkaline phosphatase (APISO): 58 U/L (ref 36–130)
BUN/Creatinine Ratio: 11 (calc) (ref 6–22)
BUN: 16 mg/dL (ref 7–25)
CO2: 31 mmol/L (ref 20–32)
Calcium: 9.6 mg/dL (ref 8.6–10.3)
Chloride: 101 mmol/L (ref 98–110)
Creat: 1.43 mg/dL — ABNORMAL HIGH (ref 0.60–1.29)
Globulin: 3.2 g/dL (calc) (ref 1.9–3.7)
Glucose, Bld: 105 mg/dL — ABNORMAL HIGH (ref 65–99)
Potassium: 4 mmol/L (ref 3.5–5.3)
Sodium: 139 mmol/L (ref 135–146)
Total Bilirubin: 0.8 mg/dL (ref 0.2–1.2)
Total Protein: 7.6 g/dL (ref 6.1–8.1)
eGFR: 60 mL/min/{1.73_m2} (ref >=60)

## 2021-06-22 LAB — HEMOGLOBIN A1C
Hgb A1c MFr Bld: 5.9 % of total Hgb — ABNORMAL HIGH (ref <5.7)
Mean Plasma Glucose: 123 mg/dL
eAG (mmol/L): 6.8 mmol/L

## 2021-06-22 LAB — CBC WITH DIFFERENTIAL/PLATELET
Absolute Monocytes: 664 cells/uL (ref 200–950)
Basophils Absolute: 49 cells/uL (ref 0–200)
Basophils Relative: 0.6 %
Eosinophils Absolute: 459 cells/uL (ref 15–500)
Eosinophils Relative: 5.6 %
HCT: 41.7 % (ref 38.5–50.0)
Hemoglobin: 13.9 g/dL (ref 13.2–17.1)
Lymphs Abs: 2378 cells/uL (ref 850–3900)
MCH: 28.8 pg (ref 27.0–33.0)
MCHC: 33.3 g/dL (ref 32.0–36.0)
MCV: 86.5 fL (ref 80.0–100.0)
MPV: 9.5 fL (ref 7.5–12.5)
Monocytes Relative: 8.1 %
Neutro Abs: 4649 cells/uL (ref 1500–7800)
Neutrophils Relative %: 56.7 %
Platelets: 303 10*3/uL (ref 140–400)
RBC: 4.82 10*6/uL (ref 4.20–5.80)
RDW: 13.3 % (ref 11.0–15.0)
Total Lymphocyte: 29 %
WBC: 8.2 10*3/uL (ref 3.8–10.8)

## 2021-06-22 LAB — LIPID PANEL W/REFLEX DIRECT LDL
Cholesterol: 187 mg/dL (ref <200)
HDL: 45 mg/dL (ref >=40)
LDL Cholesterol (Calc): 103 mg/dL (calc) — ABNORMAL HIGH
Non-HDL Cholesterol (Calc): 142 mg/dL (calc) — ABNORMAL HIGH (ref <130)
Total CHOL/HDL Ratio: 4.2 (calc) (ref <5.0)
Triglycerides: 274 mg/dL — ABNORMAL HIGH (ref <150)

## 2021-09-27 ENCOUNTER — Other Ambulatory Visit: Payer: Self-pay | Admitting: Family Medicine

## 2021-09-27 DIAGNOSIS — I10 Essential (primary) hypertension: Secondary | ICD-10-CM

## 2021-10-25 ENCOUNTER — Other Ambulatory Visit: Payer: Self-pay | Admitting: Family Medicine

## 2021-12-23 ENCOUNTER — Other Ambulatory Visit: Payer: Self-pay | Admitting: Family Medicine

## 2021-12-23 DIAGNOSIS — I10 Essential (primary) hypertension: Secondary | ICD-10-CM

## 2021-12-24 NOTE — Telephone Encounter (Signed)
Lvm for patient to call back to schedule a 6 month follow up for hypertension- tvt

## 2021-12-24 NOTE — Telephone Encounter (Signed)
Pls contact patient to schedule 17-month follow-up for hypertension. Sending 30 day refill.

## 2022-01-01 ENCOUNTER — Other Ambulatory Visit: Payer: Self-pay | Admitting: Family Medicine

## 2022-01-01 DIAGNOSIS — I10 Essential (primary) hypertension: Secondary | ICD-10-CM

## 2022-01-09 ENCOUNTER — Ambulatory Visit (INDEPENDENT_AMBULATORY_CARE_PROVIDER_SITE_OTHER): Payer: Commercial Managed Care - HMO | Admitting: Family Medicine

## 2022-01-09 ENCOUNTER — Encounter: Payer: Self-pay | Admitting: Family Medicine

## 2022-01-09 VITALS — BP 127/76 | HR 99 | Ht 68.5 in | Wt 270.0 lb

## 2022-01-09 DIAGNOSIS — Z23 Encounter for immunization: Secondary | ICD-10-CM

## 2022-01-09 DIAGNOSIS — E119 Type 2 diabetes mellitus without complications: Secondary | ICD-10-CM

## 2022-01-09 DIAGNOSIS — I10 Essential (primary) hypertension: Secondary | ICD-10-CM

## 2022-01-09 LAB — POCT GLYCOSYLATED HEMOGLOBIN (HGB A1C): HbA1c, POC (controlled diabetic range): 6.2 % (ref 0.0–7.0)

## 2022-01-09 LAB — POCT UA - MICROALBUMIN
Albumin/Creatinine Ratio, Urine, POC: <30
Creatinine, POC: 200 mg/dL
Microalbumin Ur, POC: 30 mg/L

## 2022-01-09 MED ORDER — RYBELSUS 3 MG PO TABS: 3.0000 mg | ORAL_TABLET | Freq: Every day | ORAL | 0 refills | Status: DC

## 2022-01-09 MED ORDER — RYBELSUS 7 MG PO TABS: 7.0000 mg | ORAL_TABLET | Freq: Every day | ORAL | 3 refills | Status: DC

## 2022-01-09 NOTE — Assessment & Plan Note (Signed)
Diabetes control has worsened slightly since last visit.Marland Kitchen  Recommend continuation of metformin and addition of Rybelsus.  Titrate from 3 mg to 7 mg after 1 month.

## 2022-01-09 NOTE — Patient Instructions (Signed)
Rybelsus- Take 3mg  daily x30 days then increase to 7mg .

## 2022-01-09 NOTE — Progress Notes (Signed)
Colin Garcia - 51 y.o. male MRN [id number]  Date of birth: 1971-01-26  Subjective Chief Complaint  Patient presents with   Diabetes   Hypertension    HPI Colin Garcia is a 51 year old male here today for follow-up visit.  He reports that he feels like he is doing pretty well.  Blood pressure elevated initially today.  He is taking medications as directed.  He continues to eat fast food [place] fries and chicken wings several nights per week.  He has tried incorporate some vegetables occasionally.  He denies any symptoms including chest pain, shortness of breath, palpitations, headaches or vision changes.  He is not monitoring blood sugars at home too often.  He is taking metformin daily.  Denies side effects from medication.  He is not very physically active.  ROS:  A comprehensive ROS was completed and negative except as noted per HPI  No Known Allergies  Past Medical History:  Diagnosis Date   Diabetes mellitus without complication (HCC)    Hypertension     History reviewed. No pertinent surgical history.  Social History   Socioeconomic History   Marital status: Single    Spouse name: Not on file   Number of children: Not on file   Years of education: Not on file   Highest education level: Not on file  Occupational History   Not on file  Tobacco Use   Smoking status: Former    Types: Cigarettes    Quit date: 2008    Years since quitting: 15.8   Smokeless tobacco: Never  Vaping Use   Vaping Use: Never used  Substance and Sexual Activity   Alcohol use: No   Drug use: Yes    Types: Marijuana    Comment: Daily   Sexual activity: Not on file  Other Topics Concern   Not on file  Social History Narrative   Not on file   Social Determinants of Health   Financial Resource Strain: Not on file  Food Insecurity: Not on file  Transportation Needs: Not on file  Physical Activity: Not on file  Stress: Not on file  Social Connections: Not on file    Family  History  Problem Relation Age of Onset   Hypertension Father    Diabetes Father     Health Maintenance  Topic Date Due   OPHTHALMOLOGY EXAM  04/11/2022 (Originally 11/27/1980)   COVID-19 Vaccine (1) 04/11/2022 (Originally 05/27/1971)   Zoster Vaccines- Shingrix (1 of 2) 04/11/2022 (Originally 11/27/2021)   Fecal DNA (Cologuard)  01/10/2023 (Originally 11/27/2016)   Diabetic kidney evaluation - GFR measurement  06/22/2022   FOOT EXAM  06/22/2022   HEMOGLOBIN A1C  07/10/2022   Diabetic kidney evaluation - Urine ACR  01/10/2023   TETANUS/TDAP  06/22/2031   INFLUENZA VACCINE  Completed   Hepatitis C Screening  Completed   HIV Screening  Completed   HPV VACCINES  Aged Out     ----------------------------------------------------------------------------------------------------------------------------------------------------------------------------------------------------------------- Physical Exam BP 127/76 (BP Location: Left Arm, Patient Position: Sitting, Cuff Size: Large)   Pulse 99   Ht 5' 8.5" (1.74 m)   Wt 270 lb (122.5 kg)   SpO2 98%   BMI 40.46 kg/m   Physical Exam Constitutional:      Appearance: Normal appearance.  Eyes:     General: No scleral icterus. Cardiovascular:     Rate and Rhythm: Normal rate and regular rhythm.  Pulmonary:     Effort: Pulmonary effort is normal.     Breath sounds: Normal breath  sounds.  Musculoskeletal:     Cervical back: Neck supple.  Neurological:     General: No focal deficit present.     Mental Status: He is alert.  Psychiatric:        Mood and Affect: Mood normal.        Behavior: Behavior normal.     ------------------------------------------------------------------------------------------------------------------------------------------------------------------------------------------------------------------- Assessment and Plan  Essential hypertension Recheck of blood pressure is better controlled.  Recommend continuation of  current medications.  Discussed reduction in sodium intake to help with blood pressure control as well.  Type 2 diabetes mellitus without complication, without long-term current use of insulin ([place]) Diabetes control has worsened slightly since last visit.Marland Kitchen  Recommend continuation of metformin and addition of Rybelsus.  Titrate from 3 mg to 7 mg after 1 month.   Meds ordered this encounter  Medications   Semaglutide (RYBELSUS) 3 MG TABS    Sig: Take 3 mg by mouth daily.    Dispense:  30 tablet    Refill:  0   Semaglutide (RYBELSUS) 7 MG TABS    Sig: Take 7 mg by mouth daily.    Dispense:  30 tablet    Refill:  3    Return in about 4 months (around 05/10/2022) for HTN/T2DM.    This visit occurred during the SARS-CoV-2 public health emergency.  Safety protocols were in place, including screening questions prior to the visit, additional usage of staff PPE, and extensive cleaning of exam room while observing appropriate contact time as indicated for disinfecting solutions.

## 2022-01-09 NOTE — Assessment & Plan Note (Signed)
Recheck of blood pressure is better controlled.  Recommend continuation of current medications.  Discussed reduction in sodium intake to help with blood pressure control as well.

## 2022-01-19 ENCOUNTER — Other Ambulatory Visit: Payer: Self-pay | Admitting: Family Medicine

## 2022-01-19 DIAGNOSIS — I10 Essential (primary) hypertension: Secondary | ICD-10-CM

## 2022-01-24 ENCOUNTER — Other Ambulatory Visit: Payer: Self-pay | Admitting: Family Medicine

## 2022-01-25 ENCOUNTER — Ambulatory Visit
Admission: EM | Admit: 2022-01-25 | Discharge: 2022-01-25 | Disposition: A | Discharge status: Home / Self Care | Payer: Commercial Managed Care - HMO | Attending: Internal Medicine | Admitting: Internal Medicine

## 2022-01-25 ENCOUNTER — Telehealth: Payer: Self-pay

## 2022-01-25 DIAGNOSIS — M25562 Pain in left knee: Secondary | ICD-10-CM | POA: Diagnosis not present

## 2022-01-25 MED ORDER — PREDNISONE 20 MG PO TABS: 20.0000 mg | ORAL_TABLET | Freq: Every day | ORAL | 0 refills | Status: DC

## 2022-01-25 MED ORDER — PREDNISONE 20 MG PO TABS: 20.0000 mg | ORAL_TABLET | Freq: Every day | ORAL | 0 refills | Status: AC

## 2022-01-25 NOTE — ED Provider Notes (Addendum)
[facility]    CSN: [id number] Arrival date & time: 01/25/22  1735      History   Chief Complaint Chief Complaint  Patient presents with   Knee Pain    HPI Colin Garcia is a 51 y.o. male.   Patient presents with left knee pain that has been present for 2 days.  He denies any obvious injury to the area.  Patient reports that he has intermittent pain in this knee chronically.  Patient states that multiple years ago he had pain when attempting to stand up and has been having intermittent pain ever since.  He has not taken any medications for this current pain.  Reports pain is exacerbated with movement and bearing weight.  Denies any numbness or tingling.  Denies any associated fever.   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     Home Medications    Prior to Admission medications   Medication Sig Start Date End Date Taking? Authorizing Provider  amLODipine (NORVASC) 5 MG tablet Take 1 tablet by mouth once daily 10/01/21   Everrett Coombe, DO  carvedilol (COREG) 25 MG tablet TAKE 1 TABLET BY MOUTH TWICE DAILY WITH A MEAL 01/21/22    Everrett Coombe, DO  chlorthalidone (HYGROTON) 25 MG tablet Take 1 tablet by mouth once daily 01/01/22   Everrett Coombe, DO  hydrALAZINE (APRESOLINE) 10 MG tablet Take 1 tablet (10 mg total) by mouth 4 (four) times daily. 12/13/20 03/13/21  Everrett Coombe, DO  losartan (COZAAR) 50 MG tablet Take 1 tablet by mouth once daily 01/24/22   Everrett Coombe, DO  metFORMIN (GLUCOPHAGE) 500 MG tablet TAKE 1 TABLET BY MOUTH TWICE DAILY WITH A MEAL 01/24/22   Everrett Coombe, DO  predniSONE (DELTASONE) 20 MG tablet Take 1 tablet (20 mg total) by mouth daily for 5 days. 01/25/22 01/30/22  Gustavus Bryant, FNP  Semaglutide (RYBELSUS) 3 MG TABS Take 3 mg by mouth daily. Patient not taking: Reported on 01/25/2022 01/09/22   Everrett Coombe, DO  Semaglutide (RYBELSUS) 7 MG TABS Take 7 mg by mouth daily. Patient not taking: Reported on 01/25/2022 01/09/22   Everrett Coombe, DO  sildenafil (VIAGRA) 100 MG tablet Take 0.5-1 tablets (50-100 mg total) by mouth daily as needed for erectile dysfunction. 06/30/20   Everrett Coombe, DO    Family History Family History  Problem Relation Age of Onset   Hypertension Father    Diabetes Father     Social History Social History   Tobacco Use   Smoking status: Former    Types: Cigarettes    Quit date: 2008    Years since quitting: 15.8   Smokeless  tobacco: Never  Vaping Use   Vaping Use: Never used  Substance Use Topics   Alcohol use: No   Drug use: Yes    Types: Marijuana    Comment: Daily     Allergies   Patient has no known allergies.   Review of Systems Review of Systems Per HPI  Physical Exam Triage Vital Signs ED Triage Vitals  Enc Vitals Group     BP 01/25/22 1842 (!) 163/85     Pulse Rate 01/25/22 1842 96     Resp 01/25/22 1842 15     Temp 01/25/22 1842 98.3 F (36.8 C)     Temp src --      SpO2 01/25/22 1842 98 %     Weight --      Height --      Head Circumference --      Peak Flow --      Pain Score 01/25/22 1841 8     Pain Loc --       Pain Edu? --      Excl. in [place]? --    No data found.  Updated Vital Signs BP (!) 163/85   Pulse 96   Temp 98.3 F (36.8 C)   Resp 15   SpO2 98%   Visual Acuity Right Eye Distance:   Left Eye Distance:   Bilateral Distance:    Right Eye Near:   Left Eye Near:    Bilateral Near:     Physical Exam Constitutional:      General: He is not in acute distress.    Appearance: Normal appearance. He is not toxic-appearing or diaphoretic.  HENT:     Head: Normocephalic and atraumatic.  Eyes:     Extraocular Movements: Extraocular movements intact.     Conjunctiva/sclera: Conjunctivae normal.  Pulmonary:     Effort: Pulmonary effort is normal.  Musculoskeletal:     Left knee: Swelling present. No crepitus. Tenderness present. No LCL laxity, MCL laxity, ACL laxity or PCL laxity.Normal alignment, normal meniscus and normal patellar mobility. Normal pulse.     Comments: Has moderate swelling throughout anterior knee with some erythema present directly below patella.  There is warmth throughout knee as well.  Patient has pain with range of motion and tenderness to palpation to lower patella.  Neurovascular appears intact.  No crepitus noted.  Neurological:     General: No focal deficit present.     Mental Status: He is alert and oriented to person, place, and time. Mental status is at baseline.  Psychiatric:        Mood and Affect: Mood normal.        Behavior: Behavior normal.        Thought Content: Thought content normal.        Judgment: Judgment normal.      UC Treatments / Results  Labs (all labs ordered are listed, but only abnormal results are displayed) Labs Reviewed - No data to display  EKG   Radiology No results found.  Procedures Procedures (including critical care time)  Medications Ordered in UC Medications - No data to display  Initial Impression / Assessment and Plan / UC Course  I have reviewed the triage vital signs and the nursing  notes.  Pertinent labs & imaging results that were available during my care of the patient were reviewed by me and considered in my medical decision making (see chart for details).     Differential diagnoses include osteoarthritis versus gout.  Patient denies any formal diagnosis of either one of these but does report chronic knee pain intermittently.  There is no concern for septic joint noted on exam.  Given no apparent injury will defer imaging.  Given concern for gout, will treat with prednisone to decrease inflammation.  No obvious contraindications noted to prednisone in patient's history.  Patient advised ice application as well.  Advised patient to follow-up with orthopedist at provided contact information if symptoms persist or worsen especially given chronicity of issue.  Patient was encouraged that he may follow-up here at urgent care as well if necessary.  Patient does take chlorthalidone which should be safe with prednisone but was encouraged to follow-up with PCP to have repeat blood work to ensure potassium levels will stay normal.  Last BMP had normal potassium.  Patient verbalized understanding and was agreeable with plan. Final Clinical Impressions(s) / UC Diagnoses   Final diagnoses:  Acute pain of left knee     Discharge Instructions      I have prescribed you prednisone to help alleviate pain and inflammation with your knee.  Recommend that you see an orthopedist at provided contact information if symptoms persist or worsen.  Also recommend ice application.  Follow-up if symptoms persist or worsen.    ED Prescriptions     Medication Sig Dispense Auth. Provider   predniSONE (DELTASONE) 20 MG tablet Take 1 tablet (20 mg total) by mouth daily for 5 days. 5 tablet [place], Acie Fredrickson, [place]      PDMP not reviewed this encounter.   Gustavus Bryant, [place] 01/25/22 2019    Gustavus Bryant, [place] 01/25/22 2020

## 2022-01-25 NOTE — ED Triage Notes (Signed)
Pt presents to uc with co of L knee pain for 2 days. Pt reports he woke up with the pain does not recall and injury. Pt reports knee continues to stiffin. Pt reports pain is 8/10 and is worse with movement. Pt reports otc mediations. Arthritis cream helping some.

## 2022-01-25 NOTE — Discharge Instructions (Signed)
I have prescribed you prednisone to help alleviate pain and inflammation with your knee.  Recommend that you see an orthopedist at provided contact information if symptoms persist or worsen.  Also recommend ice application.  Follow-up if symptoms persist or worsen.

## 2022-01-29 ENCOUNTER — Telehealth: Payer: Self-pay

## 2022-01-29 NOTE — Telephone Encounter (Signed)
Initiated Prior authorization PJS:[id number] 3MG tablets Via: Covermymeds Case/Key:[id number] Status: denied  as of 01/29/22 Reason:There is no clinical information indicating your patient has met both of the following: A) Individual  will continue maximally tolerated metformin therapy, if not contraindicated per FDA label, intolerant,  or otherwise not a candidate -and- B) Documentation of one of the following: 1. Unable to achieve  goal HbA1C despite 90 consecutive days of metformin or metformin-containing regimen  (meglitinides, sulfonylureas, or thiazolidinediones) at greater than or equal to 1,500 mg per day; 2.  Intolerance to metformin 1,500 mg per day despite appropriate dose titration duration (for example,  period of 8-12 weeks); 3. Contraindication to metformin per FDA label (for example, acute/chronic  metabolic acidosis, severe renal dysfunction); 4. Not a candidate for metformin (for example,  hepatic impairment, moderate renal dysfunction, unstable heart failure). For individuals not able to inject (if using injected insulin, these criteria do not apply), Rybelsus is  considered medically necessary when your patient is inadequately controlled on two concurrent  alternatives from different anti-hyperglycemic classes and metformin (unless not a candidate for  metformin). For individuals not able to inject (if using injected insulin, these criteria do not apply), Rybelsus is  considered medically necessary when your patient has documented inability to administer  injections (For example, physical impairment, visual impairment). Rybelsus is medically necessary when all the following are met: 1. Individual is 61 years of age or  older. 2. Type 2 diabetes mellitus 3. Metformin Requirement Criteria* 4. For individuals able to  inject medication (for example using insulin) - documented contraindication per FDA label,  intolerance, or inability to use Trulicity and Bydureon/Bydureon  BCise/Byetta 5. For individuals not  able to inject medication, both of the following: A. inadequately controlled on 2 concurrent  alternatives from different anti-hyperglycemic classes + metformin (Unless not a candidate for  metformin as described in Metformin Requirement Criteria above) B. documented inability to  administer injections (for example, physical impairment, visual impairment) *Metformin requirement  criteria is both of the following: 1. Individual will continue maximally tolerated metformin therapy, if  not contraindicated per FDA label, intolerant, or otherwise not a candidate 2. Documentation of one  of the following: A. unable to achieve goal HbA1C despite 90 consecutive days of metformin or  metformin-containing regimen (meglitinides, sulfonylureas, or thiazolidinediones) at greater than or  equal to 1,500 mg per day B. intolerance to metformin 1,500 mg per day despite appropriate dose  titration duration (for example, period of 8-12 weeks) C. contraindication to metformin per FDA  label (for example, acute/chronic metabolic acidosis, severe renal dysfunction) D. not a candidate  for metformin (for example, hepatic impairment, moderate renal dysfunction, unstable heart failure). [place] [id number] Prior Authorization was used to complete this review.  Notified Pt via: Mychart

## 2022-02-02 ENCOUNTER — Other Ambulatory Visit: Payer: Self-pay | Admitting: Family Medicine

## 2022-02-02 DIAGNOSIS — I10 Essential (primary) hypertension: Secondary | ICD-10-CM

## 2022-02-11 ENCOUNTER — Ambulatory Visit (INDEPENDENT_AMBULATORY_CARE_PROVIDER_SITE_OTHER): Payer: Commercial Managed Care - HMO | Admitting: Physician Assistant

## 2022-02-11 ENCOUNTER — Ambulatory Visit (INDEPENDENT_AMBULATORY_CARE_PROVIDER_SITE_OTHER): Payer: Commercial Managed Care - HMO

## 2022-02-11 DIAGNOSIS — G8929 Other chronic pain: Secondary | ICD-10-CM

## 2022-02-11 DIAGNOSIS — M25562 Pain in left knee: Secondary | ICD-10-CM

## 2022-02-11 DIAGNOSIS — M545 Low back pain, unspecified: Secondary | ICD-10-CM

## 2022-02-11 MED ORDER — MELOXICAM 7.5 MG PO TABS: 7.5000 mg | ORAL_TABLET | Freq: Every day | ORAL | 0 refills | Status: DC

## 2022-02-11 NOTE — Progress Notes (Signed)
Office Visit Note   Patient: Colin Garcia           Date of Birth: 1970/04/02           MRN: [id number] Visit Date: 02/11/2022              Requested by: Everrett Coombe, DO [address]  [address] [place],  [place] [address] PCP: Everrett Coombe, DO  Chief Complaint  Patient presents with   Left Knee - Pain   Lower Back - Pain      HPI: Colin Garcia is a very pleasant 51 year old gentleman with a history of diabetes.  He comes in today with a 3-week history of left knee pain.  He was originally seen in the emergency room and at that time it was quite swollen per his report.  He was taught that told he might have gout.  Since then the swelling is pretty much dissipated and he actually admits that today he is feeling better.  He does get some stiffness in the knee when it happens is mostly laterally.  He denies any popping or catching sensations or any instability.  Because of his gait he has irritated his lower back.  He describes pain focally in the left lower back.  Denies any radiation of symptoms down his leg any loss of bowel or bladder control any paresthesias.  Assessment & Plan: Visit Diagnoses:  1. Chronic pain of left knee   2. Acute left-sided low back pain, unspecified whether sciatica present     Plan: Cannot appreciate any effusion or abnormal findings either with his back or his knee today.  We discussed trying an injection which she declined.  Would like to put him on a regular anti-inflammatory that he should not take any other inflammatories with.  Also discussed with him using Voltaren gel.  Will follow-up with him in 2 weeks  Follow-Up Instructions: Return in about 2 weeks (around 02/25/2022).   Ortho Exam  Patient is alert, oriented, no adenopathy, well-dressed, normal affect, normal respiratory effort. Left knee no effusion no erythema no swelling.  He has good varus valgus stability is able to sustain his straight leg.  He is not really tender  over the medial lateral joint line compartments are soft nontender Low back: He has good extension and flexion slight pulling in the left lower back on flexion.  He has 5 out of 5 strength with resisted dorsiflexion plantarflexion extension flexion of his legs.  Sensation is intact.  Straight leg raise on the left mildly reproduces some of the pulling in his back  Imaging: XR KNEE 3 VIEW LEFT  Result Date: 02/11/2022 Three-view radiographs of his knee were obtained today.  Well-maintained alignment overall well-maintained joint spaces.  No evidence of any osseous injuries.  No significant degenerative changes  XR Lumbar Spine 2-3 Views  Result Date: 02/11/2022 2 views of his lumbar spine were obtained today.  Well-maintained alignment no listhesis no evidence of any fracture no loss of joint space  No images are attached to the encounter.  Labs: Lab Results  Component Value Date   HGBA1C 6.2 01/09/2022   HGBA1C 5.9 (H) 06/21/2021   HGBA1C 5.6 12/13/2020     Lab Results  Component Value Date   ALBUMIN 4.3 03/18/2019   ALBUMIN 4.5 07/07/2017   ALBUMIN 4.2 06/23/2012    No results found for: "MG" No results found for: "VD25OH"  No results found for: "PREALBUMIN"    Latest Ref Rng &  Units 06/21/2021   12:00 AM 04/27/2020   10:50 AM 03/18/2019    8:59 AM  CBC EXTENDED  WBC 3.8 - 10.8 Thousand/uL 8.2  8.6  9.3   RBC 4.20 - 5.80 Million/uL 4.82  4.60  4.80   Hemoglobin 13.2 - 17.1 g/dL 32.4  40.1  02.7   HCT 38.5 - 50.0 % 41.7  38.7  40.6   Platelets 140 - 400 Thousand/uL 303  285  280.0   NEUT# 1,500 - 7,800 cells/uL 4,649     Lymph# 850 - 3,900 cells/uL 2,378        There is no height or weight on file to calculate BMI.  Orders:  Orders Placed This Encounter  Procedures   XR Lumbar Spine 2-3 Views   XR KNEE 3 VIEW LEFT   Meds ordered this encounter  Medications   meloxicam (MOBIC) 7.5 MG tablet    Sig: Take 1 tablet (7.5 mg total) by mouth daily.    Dispense:  30  tablet    Refill:  0     Procedures: No procedures performed  Clinical Data: No additional findings.  ROS:  All other systems negative, except as noted in the HPI. Review of Systems  Objective: Vital Signs: There were no vitals taken for this visit.  Specialty Comments:  No specialty comments available.  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 Social History   Occupational History   Not on file  Tobacco Use   Smoking status: Former    Types: Cigarettes    Quit date: 2008    Years since quitting: 15.9   Smokeless tobacco: Never  Vaping Use   Vaping Use: Never used  Substance and Sexual Activity   Alcohol use: No   Drug use: Yes    Types: Marijuana    Comment: Daily   Sexual activity: Not on file

## 2022-02-19 ENCOUNTER — Other Ambulatory Visit: Payer: Self-pay | Admitting: Family Medicine

## 2022-02-19 DIAGNOSIS — I10 Essential (primary) hypertension: Secondary | ICD-10-CM

## 2022-03-14 ENCOUNTER — Ambulatory Visit: Payer: Commercial Managed Care - HMO | Admitting: Physician Assistant

## 2022-03-14 ENCOUNTER — Encounter: Payer: Self-pay | Admitting: Physician Assistant

## 2022-03-14 DIAGNOSIS — M545 Low back pain, unspecified: Secondary | ICD-10-CM | POA: Diagnosis not present

## 2022-03-14 DIAGNOSIS — M25562 Pain in left knee: Secondary | ICD-10-CM | POA: Diagnosis not present

## 2022-03-14 DIAGNOSIS — G8929 Other chronic pain: Secondary | ICD-10-CM

## 2022-03-14 NOTE — Progress Notes (Signed)
Office Visit Note   Patient: Colin Garcia           Date of Birth: 1970-12-15           MRN: [id number] Visit Date: 03/14/2022              Requested by: Luetta Nutting, [address] [place],  [place] [address] PCP: Luetta Nutting, DO  Chief Complaint  Patient presents with   Lower Back - Follow-up      HPI: Mr. Walther comes in today for follow-up on his low back pain and left knee pain.  I last saw him a month ago and he had a 3-week history of left knee knee pain and intermittent swelling.  At 1 point he went to the emergency room and was told he had gout.  He said he did have another flareup over Christmas.  Denies any injuries.  Also is having radicular pain in his left lower back that radiates down to his leg.  He says sometimes his anterior shin just "feels weird ".  Denies any loss of bowel or bladder control denies any paresthesias.  With his knee he is having some mechanical symptoms and says it feels like it gets locked up.  His knee actually is not symptomatic today.  Pain in his back is worse with forward bending and reproduces the radicular findings x-rays previously taken did not show any significant abnormalities  Assessment & Plan: Visit Diagnoses:  1. Chronic pain of left knee   2. Acute left-sided low back pain, unspecified whether sciatica present     Plan: Patient has now had ongoing left knee pain for over 6 weeks.  It has come and gone with swelling.  I do not think this is gout.  He does not have any joint in any pain in any other joints.  He does not have a family history of gout or of any inflammatory arthritis.  Given his mechanical symptoms would like to rule out a meniscus tear.  Will order an MRI of his left knee given these mechanical symptoms.  With regards to his low back he is having some radicular findings.  However he is neurovascular intact and strength is good.  I gave him some back exercises to do and he is willing to do a  little physical therapy.  He will follow-up in a month if he is no better I would consider an MRI of his lumbar spine  Follow-Up Instructions: Return in about 1 month (around 04/14/2022).   Ortho Exam  Patient is alert, oriented, no adenopathy, well-dressed, normal affect, normal respiratory effort. Left knee: No effusion.  No redness no erythema.  Most of his tenderness is along the inferior pole of the patella although he has a good resistance with straight leg raise.  Negative straight leg raising in his left leg.  He does have reproduction of his radicular symptoms running down his left buttock with bending forward.  He has strength is 5 out of 5 and his sensation is intact today his calves are soft nontender negative Bevelyn Buckles' sign  Imaging: No results found. No images are attached to the encounter.  Labs: Lab Results  Component Value Date   HGBA1C 6.2 01/09/2022   HGBA1C 5.9 (H) 06/21/2021   HGBA1C 5.6 12/13/2020     Lab Results  Component Value Date   ALBUMIN 4.3 03/18/2019   ALBUMIN 4.5 07/07/2017   ALBUMIN 4.2 06/23/2012    No  results found for: "MG" No results found for: "VD25OH"  No results found for: "PREALBUMIN"    Latest Ref Rng & Units 06/21/2021   12:00 AM 04/27/2020   10:50 AM 03/18/2019    8:59 AM  CBC EXTENDED  WBC 3.8 - 10.8 Thousand/uL 8.2  8.6  9.3   RBC 4.20 - 5.80 Million/uL 4.82  4.60  4.80   Hemoglobin 13.2 - 17.1 g/dL 13.9  13.3  13.7   HCT 38.5 - 50.0 % 41.7  38.7  40.6   Platelets 140 - 400 Thousand/uL 303  285  280.0   NEUT# 1,500 - 7,800 cells/uL 4,649     Lymph# 850 - 3,900 cells/uL 2,378        There is no height or weight on file to calculate BMI.  Orders:  Orders Placed This Encounter  Procedures   MR Knee Left w/o contrast   Ambulatory referral to Physical Therapy   No orders of the defined types were placed in this encounter.    Procedures: No procedures performed  Clinical Data: No additional findings.  ROS:  All other  systems negative, except as noted in the HPI. Review of Systems  Objective: Vital Signs: There were no vitals taken for this visit.  Specialty Comments:  No specialty comments available.  PMFS History: Patient Active Problem List   Diagnosis Date Noted   Pain in left knee 02/11/2022   Low back pain 02/11/2022   Need for hepatitis C screening test 04/30/2020   Colon cancer screening 04/30/2020   Poor compliance with CPAP treatment 07/21/2018   OSA (obstructive sleep apnea) 11/28/2017   Type 2 diabetes mellitus without complication, without long-term current use of insulin ([place]) 11/28/2017   Insufficient sleep syndrome 08/21/2017   Circadian rhythm sleep disorder, shift work type 08/21/2017   Retrognathia 08/21/2017   Morbid obesity with body mass index of 40.0-44.9 in adult ([facility]) 08/21/2017   Loud snoring 08/21/2017   Nocturia more than twice per night 08/21/2017   Essential hypertension 07/07/2017   Snoring 07/07/2017   Marijuana dependence ([facility]) 07/07/2017   Past Medical History:  Diagnosis Date   Diabetes mellitus without complication ([facility])    Hypertension     Family History  Problem Relation Age of Onset   Hypertension Father    Diabetes Father     No past surgical history on file. Social History   Occupational History   Not on file  Tobacco Use   Smoking status: Former    Types: Cigarettes    Quit date: 2008    Years since quitting: 16.0   Smokeless tobacco: Never  Vaping Use   Vaping Use: Never used  Substance and Sexual Activity   Alcohol use: No   Drug use: Yes    Types: Marijuana    Comment: Daily   Sexual activity: Not on file

## 2022-03-25 NOTE — Therapy (Incomplete)
OUTPATIENT PHYSICAL THERAPY THORACOLUMBAR EVALUATION   Patient Name: Colin Garcia MRN: [id number] DOB:January 05, 1971, 52 y.o., 52 y.o., male Today's Date: 03/25/2022  END OF SESSION:   Past Medical History:  Diagnosis Date   Diabetes mellitus without complication ([facility])    Hypertension    No past surgical history on file. Patient Active Problem List   Diagnosis Date Noted   Pain in left knee 02/11/2022   Low back pain 02/11/2022   Need for hepatitis C screening test 04/30/2020   Colon cancer screening 04/30/2020   Poor compliance with CPAP treatment 07/21/2018   OSA (obstructive sleep apnea) 11/28/2017   Type 2 diabetes mellitus without complication, without long-term current use of insulin ([facility]) 11/28/2017   Insufficient sleep syndrome 08/21/2017   Circadian rhythm sleep disorder, shift work type 08/21/2017   Retrognathia 08/21/2017   Morbid obesity with body mass index of 40.0-44.9 in adult ([facility]) 08/21/2017   Loud snoring 08/21/2017   Nocturia more than twice per night 08/21/2017   Essential hypertension 07/07/2017   Snoring 07/07/2017   Marijuana dependence ([facility]) 07/07/2017    PCP: ***  REFERRING PROVIDER: ***  REFERRING DIAG: ***  Rationale for Evaluation and Treatment: {HABREHAB:27488}  THERAPY DIAG:  No diagnosis found.  ONSET DATE: ***  SUBJECTIVE:                                                                                                                                                                                           SUBJECTIVE STATEMENT: ***  PERTINENT HISTORY:  ***  PAIN:  NPRS scale: ***/10 Pain location: *** Pain description: *** Aggravating factors: *** Relieving factors: ***  PRECAUTIONS: {Therapy precautions:24002}  WEIGHT BEARING RESTRICTIONS: {Yes ***/No:24003}  FALLS:  Has patient fallen in last 6 months? {fallsyesno:27318}  LIVING ENVIRONMENT: Lives with: {OPRC lives with:25569::"lives with their family"} Lives in: {Lives  in:25570} Stairs: {opstairs:27293} Has following equipment at home: {Assistive devices:23999}  OCCUPATION: ***  PLOF: Independent  PATIENT GOALS: ***  Next MD Visit:    OBJECTIVE:   PATIENT SURVEYS:  FOTO eval:    predicted:    SCREENING FOR RED FLAGS: Bowel or bladder incontinence: {Yes/No:304960894} Cauda equina syndrome: {Yes/No:304960894}  COGNITION: Overall cognitive status: WFL normal      SENSATION: {sensation:27233}  MUSCLE LENGTH: Hamstrings: Right *** deg; Left *** deg Thomas test: Right *** deg; Left *** deg  POSTURE: {posture:25561}  PALPATION: ***  LUMBAR ROM:   AROM eval  Flexion   Extension   Right lateral flexion   Left lateral flexion   Right rotation   Left rotation    (Blank rows = not tested)  LOWER EXTREMITY ROM:     {  AROM/PROM:27142}  Right eval Left eval  Hip flexion    Hip extension    Hip abduction    Hip adduction    Hip internal rotation    Hip external rotation    Knee flexion    Knee extension    Ankle dorsiflexion    Ankle plantarflexion    Ankle inversion    Ankle eversion     (Blank rows = not tested)  LOWER EXTREMITY MMT:    MMT Right eval Left eval  Hip flexion    Hip extension    Hip abduction    Hip adduction    Hip internal rotation    Hip external rotation    Knee flexion    Knee extension    Ankle dorsiflexion    Ankle plantarflexion    Ankle inversion    Ankle eversion     (Blank rows = not tested)  LUMBAR SPECIAL TESTS:  {lumbar special test:25242}  FUNCTIONAL TESTS:  {Functional tests:24029}  GAIT: Distance walked: *** Assistive device utilized: {Assistive devices:23999} Level of assistance: {Levels of assistance:24026} Comments: ***  TODAY'S TREATMENT:                                                                                                                              DATE: ***  Therex:    HEP instruction/performance c cues for techniques, handout provided.  Trial set  performed of each for comprehension and symptom assessment.  See below for exercise list  PATIENT EDUCATION:  Education details: HEP, POC Person educated: Patient Education method: Explanation, Demonstration, Verbal cues, and Handouts Education comprehension: verbalized understanding, returned demonstration, and verbal cues required  HOME EXERCISE PROGRAM: ***  ASSESSMENT:  CLINICAL IMPRESSION: Patient is a 52 y.o. who comes to clinic with complaints of ***pain with mobility, strength and movement coordination deficits that impair their ability to perform usual daily and recreational functional activities without increase difficulty/symptoms at this time.  Patient to benefit from skilled PT services to address impairments and limitations to improve to previous level of function without restriction secondary to condition.   OBJECTIVE IMPAIRMENTS: {opptimpairments:25111}.   ACTIVITY LIMITATIONS: {activitylimitations:27494}  PARTICIPATION LIMITATIONS: {participationrestrictions:25113}  PERSONAL FACTORS: {Personal factors:25162} are also affecting patient's functional outcome.   REHAB POTENTIAL: {rehabpotential:25112}  CLINICAL DECISION MAKING: {clinical decision making:25114}  EVALUATION COMPLEXITY: {Evaluation complexity:25115}   GOALS: Goals reviewed with patient? Yes  SHORT TERM GOALS: (target date for Short term goals are 3 weeks ***)  1. Patient will demonstrate independent use of home exercise program to maintain progress from in clinic treatments.  Goal status: New  LONG TERM GOALS: (target dates for all long term goals are 10 weeks  *** )   1. Patient will demonstrate/report pain at worst less than or equal to 2/10 to facilitate minimal limitation in daily activity secondary to pain symptoms.  Goal status: New   2. Patient will demonstrate independent use of home exercise program to facilitate ability to  maintain/progress functional gains from skilled physical  therapy services.  Goal status: New   3. Patient will demonstrate FOTO outcome > or = *** % to indicate reduced disability due to condition.  Goal status: New   4. Patient will demonstrate lumbar extension 100 % WFL s symptoms to facilitate upright standing, walking posture at PLOF s limitation.  Goal status: New   5.  ***  Goal status: New   6.  *** Goal status: New   7.  *** Goal Status: New  PLAN:  PT FREQUENCY: 1-2x/week  PT DURATION: 10 weeks  PLANNED INTERVENTIONS: Therapeutic exercises, Therapeutic activity, Neuro Muscular re-education, Balance training, Gait training, Patient/Family education, Joint mobilization, Stair training, DME instructions, Dry Needling, Electrical stimulation, Cryotherapy, vasopneumatic device, Moist heat, Taping, Traction Ultrasound, Ionotophoresis 4mg /ml Dexamethasone, and Manual therapy.  All included unless contraindicated  PLAN FOR NEXT SESSION: Review HEP knowledge/results.

## 2022-03-26 ENCOUNTER — Ambulatory Visit: Payer: Commercial Managed Care - HMO | Admitting: Rehabilitative and Restorative Service Providers"

## 2022-04-02 ENCOUNTER — Other Ambulatory Visit: Payer: Self-pay | Admitting: Family Medicine

## 2022-04-02 DIAGNOSIS — I10 Essential (primary) hypertension: Secondary | ICD-10-CM

## 2022-04-20 ENCOUNTER — Other Ambulatory Visit: Payer: Self-pay | Admitting: Family Medicine

## 2022-05-07 ENCOUNTER — Other Ambulatory Visit: Payer: Self-pay | Admitting: Family Medicine

## 2022-05-07 DIAGNOSIS — I10 Essential (primary) hypertension: Secondary | ICD-10-CM

## 2022-05-07 NOTE — Telephone Encounter (Signed)
Hold for 05/10/22 appt

## 2022-05-10 ENCOUNTER — Ambulatory Visit: Payer: Commercial Managed Care - HMO | Admitting: Family Medicine

## 2022-05-21 ENCOUNTER — Ambulatory Visit: Payer: Commercial Managed Care - HMO | Admitting: Family Medicine

## 2022-05-22 ENCOUNTER — Other Ambulatory Visit: Payer: Self-pay | Admitting: Family Medicine

## 2022-05-22 DIAGNOSIS — I10 Essential (primary) hypertension: Secondary | ICD-10-CM

## 2022-05-24 ENCOUNTER — Encounter: Payer: Self-pay | Admitting: Family Medicine

## 2022-05-24 ENCOUNTER — Ambulatory Visit (INDEPENDENT_AMBULATORY_CARE_PROVIDER_SITE_OTHER): Payer: Commercial Managed Care - HMO | Admitting: Family Medicine

## 2022-05-24 VITALS — BP 130/85 | HR 82 | Ht 68.5 in | Wt 266.0 lb

## 2022-05-24 DIAGNOSIS — E119 Type 2 diabetes mellitus without complications: Secondary | ICD-10-CM

## 2022-05-24 DIAGNOSIS — Z125 Encounter for screening for malignant neoplasm of prostate: Secondary | ICD-10-CM

## 2022-05-24 DIAGNOSIS — I10 Essential (primary) hypertension: Secondary | ICD-10-CM

## 2022-05-24 DIAGNOSIS — M2550 Pain in unspecified joint: Secondary | ICD-10-CM | POA: Diagnosis not present

## 2022-05-24 LAB — POCT GLYCOSYLATED HEMOGLOBIN (HGB A1C): HbA1c, POC (controlled diabetic range): 5.8 % (ref 0.0–7.0)

## 2022-05-24 NOTE — Progress Notes (Signed)
Colin Garcia - 52 y.o. male MRN [id number]  Date of birth: 18-Oct-1970  Subjective Chief Complaint  Patient presents with   Hypertension   Diabetes    HPI Colin Garcia is a 52 y.o. male here today for follow up visit.   He is prescribed combination of Rybelsus and metformin for management of his diabetes.  He is not taking rybelsus due to cost.  He is tolerating metformin well.    BP is treated with several medications including amlodipine, coreg, chlorthalidone, losartan and hydralazine.  He reports compliance with all medications.  He denies chest pain, shortness of breath, palpitations, headache or vision changes.    ROS:  A comprehensive ROS was completed and negative except as noted per HPI  No Known Allergies  Past Medical History:  Diagnosis Date   Diabetes mellitus without complication ([facility])    Hypertension     History reviewed. No pertinent surgical history.  Social History   Socioeconomic History   Marital status: Single    Spouse name: Not on file   Number of children: Not on file   Years of education: Not on file   Highest education level: Not on file  Occupational History   Not on file  Tobacco Use   Smoking status: Former    Types: Cigarettes    Quit date: 2008    Years since quitting: 16.2   Smokeless tobacco: Never  Vaping Use   Vaping Use: Never used  Substance and Sexual Activity   Alcohol use: No   Drug use: Yes    Types: Marijuana    Comment: Daily   Sexual activity: Not on file  Other Topics Concern   Not on file  Social History Narrative   Not on file   Social Determinants of Health   Financial Resource Strain: Not on file  Food Insecurity: Not on file  Transportation Needs: Not on file  Physical Activity: Not on file  Stress: Not on file  Social Connections: Not on file    Family History  Problem Relation Age of Onset   Hypertension Father    Diabetes Father     Health Maintenance  Topic Date Due   Zoster Vaccines-  Shingrix (1 of 2) Never done   Diabetic kidney evaluation - eGFR measurement  06/22/2022   OPHTHALMOLOGY EXAM  11/24/2022 (Originally 11/27/1981)   Fecal DNA (Cologuard)  01/10/2023 (Originally 11/27/2016)   COVID-19 Vaccine (1) 06/09/2023 (Originally [birth year]1972)   FOOT EXAM  06/22/2022   HEMOGLOBIN A1C  07/10/2022   Diabetic kidney evaluation - Urine ACR  01/10/2023   DTaP/Tdap/Td (2 - Td or Tdap) 06/22/2031   INFLUENZA VACCINE  Completed   Hepatitis C Screening  Completed   HIV Screening  Completed   HPV VACCINES  Aged Out     ----------------------------------------------------------------------------------------------------------------------------------------------------------------------------------------------------------------- Physical Exam BP 130/85 (BP Location: Left Arm, Patient Position: Sitting, Cuff Size: Large)   Pulse 82   Ht 5' 8.5" (1.74 m)   Wt 266 lb (120.7 kg)   SpO2 98%   BMI 39.86 kg/m   Physical Exam Constitutional:      Appearance: Normal appearance.  HENT:     Head: Normocephalic and atraumatic.  Eyes:     General: No scleral icterus. Cardiovascular:     Rate and Rhythm: Normal rate and regular rhythm.  Pulmonary:     Effort: Pulmonary effort is normal.     Breath sounds: Normal breath sounds.  Musculoskeletal:     Cervical back: Neck supple.  Neurological:     General: No focal deficit present.     Mental Status: He is alert.  Psychiatric:        Mood and Affect: Mood normal.        Behavior: Behavior normal.     ------------------------------------------------------------------------------------------------------------------------------------------------------------------------------------------------------------------- Assessment and Plan  Essential hypertension BP is pretty well controlled.  Continue on current medications for management of HTN.    Type 2 diabetes mellitus without complication, without long-term current use of insulin  (HCC) Blood sugars are well controlled at this time. Continue metformin at current strength.  Will hold off on adding Rybelsus on at this time.     No orders of the defined types were placed in this encounter.   No follow-ups on file.    This visit occurred during the SARS-CoV-2 public health emergency.  Safety protocols were in place, including screening questions prior to the visit, additional usage of staff PPE, and extensive cleaning of exam room while observing appropriate contact time as indicated for disinfecting solutions.

## 2022-05-24 NOTE — Assessment & Plan Note (Addendum)
Blood sugars are well controlled at this time. Continue metformin at current strength.  Will hold off on adding Rybelsus on at this time.

## 2022-05-24 NOTE — Assessment & Plan Note (Signed)
BP is pretty well controlled.  Continue on current medications for management of HTN.

## 2022-05-25 LAB — CBC WITH DIFFERENTIAL/PLATELET
Absolute Monocytes: 672 cells/uL (ref 200–950)
Basophils Absolute: 49 cells/uL (ref 0–200)
Basophils Relative: 0.6 %
Eosinophils Absolute: 435 cells/uL (ref 15–500)
Eosinophils Relative: 5.3 %
HCT: 39.5 % (ref 38.5–50.0)
Hemoglobin: 13.5 g/dL (ref 13.2–17.1)
Lymphs Abs: 2345 cells/uL (ref 850–3900)
MCH: 28.8 pg (ref 27.0–33.0)
MCHC: 34.2 g/dL (ref 32.0–36.0)
MCV: 84.4 fL (ref 80.0–100.0)
MPV: 9.4 fL (ref 7.5–12.5)
Monocytes Relative: 8.2 %
Neutro Abs: 4699 cells/uL (ref 1500–7800)
Neutrophils Relative %: 57.3 %
Platelets: 319 10*3/uL (ref 140–400)
RBC: 4.68 10*6/uL (ref 4.20–5.80)
RDW: 13.5 % (ref 11.0–15.0)
Total Lymphocyte: 28.6 %
WBC: 8.2 10*3/uL (ref 3.8–10.8)

## 2022-05-25 LAB — LIPID PANEL W/REFLEX DIRECT LDL
Cholesterol: 183 mg/dL (ref <200)
HDL: 43 mg/dL (ref >=40)
LDL Cholesterol (Calc): 107 mg/dL (calc) — ABNORMAL HIGH
Non-HDL Cholesterol (Calc): 140 mg/dL (calc) — ABNORMAL HIGH (ref <130)
Total CHOL/HDL Ratio: 4.3 (calc) (ref <5.0)
Triglycerides: 209 mg/dL — ABNORMAL HIGH (ref <150)

## 2022-05-25 LAB — COMPLETE METABOLIC PANEL WITH GFR
AG Ratio: 1.4 (calc) (ref 1.0–2.5)
ALT: 16 U/L (ref 9–46)
AST: 17 U/L (ref 10–35)
Albumin: 4.5 g/dL (ref 3.6–5.1)
Alkaline phosphatase (APISO): 56 U/L (ref 35–144)
BUN/Creatinine Ratio: 16 (calc) (ref 6–22)
BUN: 23 mg/dL (ref 7–25)
CO2: 30 mmol/L (ref 20–32)
Calcium: 9.7 mg/dL (ref 8.6–10.3)
Chloride: 103 mmol/L (ref 98–110)
Creat: 1.41 mg/dL — ABNORMAL HIGH (ref 0.70–1.30)
Globulin: 3.2 g/dL (calc) (ref 1.9–3.7)
Glucose, Bld: 108 mg/dL — ABNORMAL HIGH (ref 65–99)
Potassium: 3.6 mmol/L (ref 3.5–5.3)
Sodium: 141 mmol/L (ref 135–146)
Total Bilirubin: 0.7 mg/dL (ref 0.2–1.2)
Total Protein: 7.7 g/dL (ref 6.1–8.1)
eGFR: 61 mL/min/{1.73_m2} (ref >=60)

## 2022-05-25 LAB — PSA: PSA: 0.81 ng/mL (ref <=4.00)

## 2022-05-25 LAB — URIC ACID: Uric Acid, Serum: 9.4 mg/dL — ABNORMAL HIGH (ref 4.0–8.0)

## 2022-05-31 ENCOUNTER — Other Ambulatory Visit: Payer: Self-pay | Admitting: Family Medicine

## 2022-05-31 MED ORDER — ALLOPURINOL 100 MG PO TABS: 200.0000 mg | ORAL_TABLET | Freq: Every day | ORAL | 1 refills | Status: DC

## 2022-07-01 ENCOUNTER — Other Ambulatory Visit: Payer: Self-pay | Admitting: Family Medicine

## 2022-07-01 DIAGNOSIS — I10 Essential (primary) hypertension: Secondary | ICD-10-CM

## 2022-07-21 ENCOUNTER — Other Ambulatory Visit: Payer: Self-pay | Admitting: Family Medicine

## 2022-09-16 ENCOUNTER — Other Ambulatory Visit: Payer: Self-pay | Admitting: Family Medicine

## 2022-09-16 DIAGNOSIS — I10 Essential (primary) hypertension: Secondary | ICD-10-CM

## 2022-09-25 ENCOUNTER — Other Ambulatory Visit: Payer: Self-pay | Admitting: Family Medicine

## 2022-09-25 DIAGNOSIS — I10 Essential (primary) hypertension: Secondary | ICD-10-CM

## 2022-10-22 ENCOUNTER — Other Ambulatory Visit: Payer: Self-pay | Admitting: Family Medicine

## 2022-11-15 ENCOUNTER — Ambulatory Visit (INDEPENDENT_AMBULATORY_CARE_PROVIDER_SITE_OTHER): Payer: Commercial Managed Care - HMO | Admitting: Family Medicine

## 2022-11-15 ENCOUNTER — Encounter: Payer: Self-pay | Admitting: Family Medicine

## 2022-11-15 VITALS — BP 118/73 | HR 80 | Ht 68.5 in | Wt 264.0 lb

## 2022-11-15 DIAGNOSIS — I10 Essential (primary) hypertension: Secondary | ICD-10-CM | POA: Diagnosis not present

## 2022-11-15 DIAGNOSIS — L600 Ingrowing nail: Secondary | ICD-10-CM | POA: Insufficient documentation

## 2022-11-15 DIAGNOSIS — E119 Type 2 diabetes mellitus without complications: Secondary | ICD-10-CM

## 2022-11-15 DIAGNOSIS — Z23 Encounter for immunization: Secondary | ICD-10-CM | POA: Diagnosis not present

## 2022-11-15 DIAGNOSIS — Z7984 Long term (current) use of oral hypoglycemic drugs: Secondary | ICD-10-CM

## 2022-11-15 LAB — POCT GLYCOSYLATED HEMOGLOBIN (HGB A1C): HbA1c, POC (controlled diabetic range): 5.6 % (ref 0.0–7.0)

## 2022-11-15 NOTE — Assessment & Plan Note (Signed)
BP is pretty well controlled.  Continue on current medications for management of HTN.

## 2022-11-15 NOTE — Progress Notes (Addendum)
Colin Garcia - 52 y.o. male MRN [id number]  Date of birth: 02/24/1971  Subjective Chief Complaint  Patient presents with   Hypertension   Diabetes    HPI Colin Garcia is a 52 y.o. male here today for follow up visit.   He reports that he is doing pretty well  He continues on losartan, chlorthalidone, amlodipine, coreg and hydralazine for HTN.  He reports compliance with medications.  BP today is well controlled.  He denies chest pain, shortness of breath, palpitations, headache or vision changes.   Diabetes has been pretty well controlled with metformin.  No issues with tolerance of medication.    ROS:  A comprehensive ROS was completed and negative except as noted per HPI   No Known Allergies  Past Medical History:  Diagnosis Date   Diabetes mellitus without complication (HCC)    Hypertension     History reviewed. No pertinent surgical history.  Social History   Socioeconomic History   Marital status: Single    Spouse name: Not on file   Number of children: Not on file   Years of education: Not on file   Highest education level: Not on file  Occupational History   Not on file  Tobacco Use   Smoking status: Former    Current packs/day: 0.00    Types: Cigarettes    Quit date: 2008    Years since quitting: 16.6   Smokeless tobacco: Never  Vaping Use   Vaping status: Never Used  Substance and Sexual Activity   Alcohol use: No   Drug use: Yes    Types: Marijuana    Comment: Daily   Sexual activity: Not on file  Other Topics Concern   Not on file  Social History Narrative   Not on file   Social Determinants of Health   Financial Resource Strain: Not on file  Food Insecurity: Not on file  Transportation Needs: Not on file  Physical Activity: Not on file  Stress: Not on file  Social Connections: Not on file    Family History  Problem Relation Age of Onset   Hypertension Father    Diabetes Father     Health Maintenance  Topic Date Due    FOOT EXAM  06/22/2022   OPHTHALMOLOGY EXAM  11/24/2022 (Originally 11/27/1981)   Fecal DNA (Cologuard)  01/10/2023 (Originally 11/27/2016)   COVID-19 Vaccine (1 - 2023-24 season) 01/31/2023 (Originally 11/10/2022)   Zoster Vaccines- Shingrix (1 of 2) 02/14/2023 (Originally 11/27/2021)   Diabetic kidney evaluation - Urine ACR  01/10/2023   HEMOGLOBIN A1C  05/15/2023   Diabetic kidney evaluation - eGFR measurement  05/24/2023   DTaP/Tdap/Td (2 - Td or Tdap) 06/22/2031   INFLUENZA VACCINE  Completed   Hepatitis C Screening  Completed   HIV Screening  Completed   HPV VACCINES  Aged Out     ----------------------------------------------------------------------------------------------------------------------------------------------------------------------------------------------------------------- Physical Exam BP 118/73 (BP Location: Left Arm, Patient Position: Sitting, Cuff Size: Large)   Pulse 80   Ht 5' 8.5" (1.74 m)   Wt 264 lb (119.7 kg)   SpO2 98%   BMI 39.56 kg/m   Physical Exam Constitutional:      Appearance: Normal appearance.  Eyes:     General: No scleral icterus. Cardiovascular:     Rate and Rhythm: Normal rate and regular rhythm.  Pulmonary:     Effort: Pulmonary effort is normal.     Breath sounds: Normal breath sounds.  Musculoskeletal:     Cervical back: Neck supple.  Neurological:  Mental Status: He is alert.  Psychiatric:        Mood and Affect: Mood normal.        Behavior: Behavior normal.     ------------------------------------------------------------------------------------------------------------------------------------------------------------------------------------------------------------------- Assessment and Plan  Essential hypertension BP is pretty well controlled.  Continue on current medications for management of HTN.    Type 2 diabetes mellitus without complication, without long-term current use of insulin (HCC) Blood sugars are well  controlled at this time. Continue metformin at current strength.    Ingrown toenail Referral entered for podiatry.    No orders of the defined types were placed in this encounter.   Return in about 6 months (around 05/15/2023) for F/u HTN/T2DM.    This visit occurred during the SARS-CoV-2 public health emergency.  Safety protocols were in place, including screening questions prior to the visit, additional usage of staff PPE, and extensive cleaning of exam room while observing appropriate contact time as indicated for disinfecting solutions.

## 2022-11-15 NOTE — Assessment & Plan Note (Signed)
Referral entered for podiatry.

## 2022-11-15 NOTE — Assessment & Plan Note (Signed)
Blood sugars are well controlled at this time. Continue metformin at current strength.

## 2022-11-22 ENCOUNTER — Other Ambulatory Visit: Payer: Self-pay | Admitting: Family Medicine

## 2022-11-22 DIAGNOSIS — I10 Essential (primary) hypertension: Secondary | ICD-10-CM

## 2022-12-06 ENCOUNTER — Ambulatory Visit: Payer: Commercial Managed Care - HMO | Admitting: Podiatry

## 2022-12-12 ENCOUNTER — Ambulatory Visit: Payer: Commercial Managed Care - HMO | Admitting: Podiatry

## 2022-12-19 ENCOUNTER — Other Ambulatory Visit: Payer: Self-pay | Admitting: Family Medicine

## 2022-12-19 DIAGNOSIS — I10 Essential (primary) hypertension: Secondary | ICD-10-CM

## 2022-12-25 ENCOUNTER — Encounter: Payer: Self-pay | Admitting: Podiatry

## 2022-12-25 ENCOUNTER — Ambulatory Visit (INDEPENDENT_AMBULATORY_CARE_PROVIDER_SITE_OTHER): Payer: Managed Care, Other (non HMO) | Admitting: Podiatry

## 2022-12-25 DIAGNOSIS — L600 Ingrowing nail: Secondary | ICD-10-CM

## 2022-12-25 NOTE — Progress Notes (Signed)
Subjective:   Patient ID: Colin Garcia, male   DOB: 52 y.o.   MRN: [id number]   HPI Patient presents with chronic ingrown toenail deformity hallux bilateral that are painful when pressed and make shoe gear difficult.  States his nails have been thickened for a long time and that he has tried to work with them but they are gradually getting worse right over left.  Patient does not smoke and likes to be active   Review of Systems  All other systems reviewed and are negative.       Objective:  Physical Exam Vitals and nursing note reviewed.  Constitutional:      Appearance: He is well-developed.  Pulmonary:     Effort: Pulmonary effort is normal.  Musculoskeletal:        General: Normal range of motion.  Skin:    General: Skin is warm.  Neurological:     Mental Status: He is alert.     Neurovascular status was found to be intact muscle strength found to be adequate range of motion adequate with incurvated medial borders of the big toe both feet painful when pressed with thickness of the nailbed and pain with palpation.  Patient is noted to have good digital perfusion has diabetes under excellent control with A1c 5.9.  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  At this point we are going to remove the nail borders bilateral and I explained procedure risk and patient wants to have these fixed understanding risk and signed consent form.  I infiltrated each big toe 60 mg like Marcaine mixture sterile prep done and using sterile instrumentation remove the medial borders applied chemical for permanent nail correction and surgery and then applied alcohol lavage.  I then did sterile dressings instructed on elevation compression for 24 hours and take it off earlier if throbbing were to occur

## 2022-12-25 NOTE — Patient Instructions (Signed)

## 2022-12-30 ENCOUNTER — Other Ambulatory Visit: Payer: Self-pay | Admitting: Family Medicine

## 2022-12-30 DIAGNOSIS — I10 Essential (primary) hypertension: Secondary | ICD-10-CM

## 2023-01-21 ENCOUNTER — Other Ambulatory Visit: Payer: Self-pay | Admitting: Family Medicine

## 2023-03-28 ENCOUNTER — Other Ambulatory Visit: Payer: Self-pay | Admitting: Family Medicine

## 2023-03-28 DIAGNOSIS — I10 Essential (primary) hypertension: Secondary | ICD-10-CM

## 2023-04-05 ENCOUNTER — Other Ambulatory Visit: Payer: Self-pay | Admitting: Family Medicine

## 2023-04-05 DIAGNOSIS — I10 Essential (primary) hypertension: Secondary | ICD-10-CM

## 2023-04-21 ENCOUNTER — Other Ambulatory Visit: Payer: Self-pay | Admitting: Family Medicine

## 2023-05-08 ENCOUNTER — Other Ambulatory Visit: Payer: Self-pay | Admitting: Family Medicine

## 2023-05-08 DIAGNOSIS — I10 Essential (primary) hypertension: Secondary | ICD-10-CM

## 2023-05-12 ENCOUNTER — Other Ambulatory Visit: Payer: Self-pay | Admitting: Family Medicine

## 2023-05-12 DIAGNOSIS — I10 Essential (primary) hypertension: Secondary | ICD-10-CM

## 2023-05-15 ENCOUNTER — Ambulatory Visit: Payer: Commercial Managed Care - HMO | Admitting: Family Medicine

## 2023-06-09 ENCOUNTER — Other Ambulatory Visit: Payer: Self-pay | Admitting: Family Medicine

## 2023-06-09 DIAGNOSIS — I10 Essential (primary) hypertension: Secondary | ICD-10-CM

## 2023-06-16 ENCOUNTER — Ambulatory Visit: Admitting: Family Medicine

## 2023-06-18 ENCOUNTER — Ambulatory Visit: Admitting: Family Medicine

## 2023-06-23 ENCOUNTER — Ambulatory Visit: Admitting: Family Medicine

## 2023-06-26 ENCOUNTER — Ambulatory Visit: Admitting: Family Medicine

## 2023-06-26 ENCOUNTER — Encounter: Payer: Self-pay | Admitting: Family Medicine

## 2023-06-26 VITALS — BP 144/87 | HR 82 | Ht 68.5 in | Wt 257.0 lb

## 2023-06-26 DIAGNOSIS — Z125 Encounter for screening for malignant neoplasm of prostate: Secondary | ICD-10-CM | POA: Diagnosis not present

## 2023-06-26 DIAGNOSIS — I10 Essential (primary) hypertension: Secondary | ICD-10-CM | POA: Diagnosis not present

## 2023-06-26 DIAGNOSIS — E781 Pure hyperglyceridemia: Secondary | ICD-10-CM | POA: Diagnosis not present

## 2023-06-26 DIAGNOSIS — Z1211 Encounter for screening for malignant neoplasm of colon: Secondary | ICD-10-CM

## 2023-06-26 DIAGNOSIS — Z7984 Long term (current) use of oral hypoglycemic drugs: Secondary | ICD-10-CM

## 2023-06-26 DIAGNOSIS — E119 Type 2 diabetes mellitus without complications: Secondary | ICD-10-CM | POA: Diagnosis not present

## 2023-06-26 LAB — POCT GLYCOSYLATED HEMOGLOBIN (HGB A1C): HbA1c, POC (controlled diabetic range): 5.8 % (ref 0.0–7.0)

## 2023-06-26 NOTE — Assessment & Plan Note (Signed)
BP is pretty well controlled.  Continue on current medications for management of HTN.

## 2023-06-26 NOTE — Assessment & Plan Note (Signed)
Updating lipid panel.  ?

## 2023-06-26 NOTE — Assessment & Plan Note (Signed)
Blood sugars are well controlled at this time. Continue metformin at current strength.

## 2023-06-26 NOTE — Progress Notes (Signed)
 Colin Garcia - 53 y.o. male MRN [id number]  Date of birth: 1970/10/07  Subjective Chief Complaint  Patient presents with   Diabetes    HPI Colin Garcia is a 53 y.o. male here today for follow up.   He reports that he is doing well.   He continues on amlodipine, coreg, chlorthalidone, losartan and hydralazine.  BP elevated on initial check today.  He denies symptoms related to HTN.  He has not had chest pain, shortness of breath, palpitations, headache or vision changes.   Continues on meformin for management of diabetes.  He is tolerating this well at current strength. Remains well controlled with A1c of 5.8%  ROS:  A comprehensive ROS was completed and negative except as noted per HPI  No Known Allergies  Past Medical History:  Diagnosis Date   Diabetes mellitus without complication (HCC)    Hypertension     History reviewed. No pertinent surgical history.  Social History   Socioeconomic History   Marital status: Single    Spouse name: Not on file   Number of children: Not on file   Years of education: Not on file   Highest education level: Not on file  Occupational History   Not on file  Tobacco Use   Smoking status: Former    Current packs/day: 0.00    Types: Cigarettes    Quit date: 2008    Years since quitting: 17.3   Smokeless tobacco: Never  Vaping Use   Vaping status: Never Used  Substance and Sexual Activity   Alcohol use: No   Drug use: Yes    Types: Marijuana    Comment: Daily   Sexual activity: Not on file  Other Topics Concern   Not on file  Social History Narrative   Not on file   Social Drivers of Health   Financial Resource Strain: Not on file  Food Insecurity: Not on file  Transportation Needs: Not on file  Physical Activity: Not on file  Stress: Not on file  Social Connections: Not on file    Family History  Problem Relation Age of Onset   Hypertension Father    Diabetes Father     Health Maintenance  Topic Date Due    OPHTHALMOLOGY EXAM  Never done   Fecal DNA (Cologuard)  Never done   Pneumococcal Vaccine 53-67 Years old (2 of 2 - PCV) 04/27/2021   Zoster Vaccines- Shingrix (1 of 2) Never done   COVID-19 Vaccine (1 - 2024-25 season) Never done   Diabetic kidney evaluation - Urine ACR  01/10/2023   Diabetic kidney evaluation - eGFR measurement  05/24/2023   INFLUENZA VACCINE  10/10/2023   FOOT EXAM  11/15/2023   HEMOGLOBIN A1C  12/26/2023   DTaP/Tdap/Td (2 - Td or Tdap) 06/22/2031   Hepatitis C Screening  Completed   HIV Screening  Completed   HPV VACCINES  Aged Out   Meningococcal B Vaccine  Aged Out     ----------------------------------------------------------------------------------------------------------------------------------------------------------------------------------------------------------------- Physical Exam BP (!) 144/87 (BP Location: Left Arm, Patient Position: Sitting, Cuff Size: Large)   Pulse 82   Ht 5' 8.5" (1.74 m)   Wt 257 lb (116.6 kg)   SpO2 98%   BMI 38.51 kg/m   Physical Exam Constitutional:      Appearance: Normal appearance.  HENT:     Head: Normocephalic and atraumatic.  Eyes:     General: No scleral icterus. Cardiovascular:     Rate and Rhythm: Normal rate and regular rhythm.  Pulmonary:     Effort: Pulmonary effort is normal.     Breath sounds: Normal breath sounds.  Neurological:     Mental Status: He is alert.  Psychiatric:        Mood and Affect: Mood normal.        Behavior: Behavior normal.     ------------------------------------------------------------------------------------------------------------------------------------------------------------------------------------------------------------------- Assessment and Plan  Type 2 diabetes mellitus without complication, without long-term current use of insulin (HCC) Blood sugars are well controlled at this time. Continue metformin at current strength.    Hypertriglyceridemia Updating  lipid panel  Essential hypertension BP is pretty well controlled.  Continue on current medications for management of HTN.     No orders of the defined types were placed in this encounter.   No follow-ups on file.

## 2023-06-27 LAB — PSA: Prostate Specific Ag, Serum: 1.1 ng/mL (ref 0.0–4.0)

## 2023-06-27 LAB — CMP14+EGFR
ALT: 18 IU/L (ref 0–44)
AST: 19 IU/L (ref 0–40)
Albumin: 4.7 g/dL (ref 3.8–4.9)
Alkaline Phosphatase: 83 IU/L (ref 44–121)
BUN/Creatinine Ratio: 15 (ref 9–20)
BUN: 23 mg/dL (ref 6–24)
Bilirubin Total: 0.5 mg/dL (ref 0.0–1.2)
CO2: 25 mmol/L (ref 20–29)
Calcium: 10 mg/dL (ref 8.7–10.2)
Chloride: 99 mmol/L (ref 96–106)
Creatinine, Ser: 1.54 mg/dL — ABNORMAL HIGH (ref 0.76–1.27)
Globulin, Total: 3.1 g/dL (ref 1.5–4.5)
Glucose: 103 mg/dL — ABNORMAL HIGH (ref 70–99)
Potassium: 4 mmol/L (ref 3.5–5.2)
Sodium: 141 mmol/L (ref 134–144)
Total Protein: 7.8 g/dL (ref 6.0–8.5)
eGFR: 54 mL/min/{1.73_m2} — ABNORMAL LOW (ref >59)

## 2023-06-27 LAB — CBC WITH DIFFERENTIAL/PLATELET
Basophils Absolute: 0 10*3/uL (ref 0.0–0.2)
Basos: 1 %
EOS (ABSOLUTE): 0.4 10*3/uL (ref 0.0–0.4)
Eos: 4 %
Hematocrit: 43.5 % (ref 37.5–51.0)
Hemoglobin: 14.8 g/dL (ref 13.0–17.7)
Immature Grans (Abs): 0 10*3/uL (ref 0.0–0.1)
Immature Granulocytes: 0 %
Lymphocytes Absolute: 1.9 10*3/uL (ref 0.7–3.1)
Lymphs: 22 %
MCH: 29 pg (ref 26.6–33.0)
MCHC: 34 g/dL (ref 31.5–35.7)
MCV: 85 fL (ref 79–97)
Monocytes Absolute: 0.6 10*3/uL (ref 0.1–0.9)
Monocytes: 7 %
Neutrophils Absolute: 5.6 10*3/uL (ref 1.4–7.0)
Neutrophils: 66 %
Platelets: 311 10*3/uL (ref 150–450)
RBC: 5.1 x10E6/uL (ref 4.14–5.80)
RDW: 12.9 % (ref 11.6–15.4)
WBC: 8.5 10*3/uL (ref 3.4–10.8)

## 2023-06-27 LAB — MICROALBUMIN / CREATININE URINE RATIO
Creatinine, Urine: 117.7 mg/dL
Microalb/Creat Ratio: 4 mg/g{creat} (ref 0–29)
Microalbumin, Urine: 4.3 ug/mL

## 2023-06-28 ENCOUNTER — Other Ambulatory Visit: Payer: Self-pay | Admitting: Family Medicine

## 2023-06-28 DIAGNOSIS — I10 Essential (primary) hypertension: Secondary | ICD-10-CM

## 2023-07-11 ENCOUNTER — Encounter: Payer: Self-pay | Admitting: Family Medicine

## 2023-07-23 ENCOUNTER — Other Ambulatory Visit: Payer: Self-pay | Admitting: Family Medicine

## 2023-09-08 ENCOUNTER — Other Ambulatory Visit: Payer: Self-pay | Admitting: Family Medicine

## 2023-09-08 DIAGNOSIS — I10 Essential (primary) hypertension: Secondary | ICD-10-CM

## 2023-09-11 ENCOUNTER — Other Ambulatory Visit: Payer: Self-pay | Admitting: Family Medicine

## 2023-09-11 DIAGNOSIS — I10 Essential (primary) hypertension: Secondary | ICD-10-CM

## 2023-10-19 ENCOUNTER — Other Ambulatory Visit: Payer: Self-pay | Admitting: Family Medicine

## 2023-10-19 DIAGNOSIS — I10 Essential (primary) hypertension: Secondary | ICD-10-CM

## 2023-10-19 DIAGNOSIS — E119 Type 2 diabetes mellitus without complications: Secondary | ICD-10-CM

## 2023-12-19 ENCOUNTER — Other Ambulatory Visit: Payer: Self-pay | Admitting: Family Medicine

## 2023-12-19 DIAGNOSIS — I10 Essential (primary) hypertension: Secondary | ICD-10-CM

## 2023-12-25 ENCOUNTER — Other Ambulatory Visit: Payer: Self-pay | Admitting: Family Medicine

## 2023-12-25 DIAGNOSIS — I10 Essential (primary) hypertension: Secondary | ICD-10-CM

## 2023-12-26 ENCOUNTER — Ambulatory Visit: Admitting: Family Medicine

## 2024-01-01 ENCOUNTER — Other Ambulatory Visit: Payer: Self-pay | Admitting: Family Medicine

## 2024-01-01 DIAGNOSIS — I10 Essential (primary) hypertension: Secondary | ICD-10-CM

## 2024-01-13 ENCOUNTER — Ambulatory Visit: Admitting: Family Medicine

## 2024-01-20 ENCOUNTER — Encounter: Payer: Self-pay | Admitting: Family Medicine

## 2024-01-20 ENCOUNTER — Other Ambulatory Visit: Payer: Self-pay | Admitting: Family Medicine

## 2024-01-20 ENCOUNTER — Ambulatory Visit: Payer: Self-pay | Admitting: Family Medicine

## 2024-01-20 DIAGNOSIS — E781 Pure hyperglyceridemia: Secondary | ICD-10-CM

## 2024-01-20 DIAGNOSIS — I10 Essential (primary) hypertension: Secondary | ICD-10-CM

## 2024-01-20 DIAGNOSIS — Z23 Encounter for immunization: Secondary | ICD-10-CM

## 2024-01-20 DIAGNOSIS — Z7984 Long term (current) use of oral hypoglycemic drugs: Secondary | ICD-10-CM

## 2024-01-20 DIAGNOSIS — E119 Type 2 diabetes mellitus without complications: Secondary | ICD-10-CM

## 2024-01-20 DIAGNOSIS — I1A0 Resistant hypertension: Secondary | ICD-10-CM

## 2024-01-20 LAB — POCT GLYCOSYLATED HEMOGLOBIN (HGB A1C): HbA1c, POC (controlled diabetic range): 5.9 % (ref 0.0–7.0)

## 2024-01-20 NOTE — Assessment & Plan Note (Signed)
 Repeat lipid panel ?

## 2024-01-20 NOTE — Progress Notes (Signed)
 Colin Garcia - 53 y.o. male MRN [id number]  Date of birth: 1970-05-30  Subjective Chief Complaint  Patient presents with   Diabetes   Hypertension    HPI Colin Garcia is a 53 y.o. male here today for follow up visit.    He reports that he is doing well.   He continues on current medications for management of HTN.  BP is elevated today.  He has not had chest pain, shortness of breath, palpitations, headache or vision changes.  He admits that diet could be better as he does consume quite a bit of salt.   He remains on metformin  for management of diabetes.  His A1c remains well controlled at 5.9%.    ROS:  A comprehensive ROS was completed and negative except as noted per HPI  No Known Allergies  Past Medical History:  Diagnosis Date   Diabetes mellitus without complication (HCC)    Hypertension     No past surgical history on file.  Social History   Socioeconomic History   Marital status: Single    Spouse name: Not on file   Number of children: Not on file   Years of education: Not on file   Highest education level: Not on file  Occupational History   Not on file  Tobacco Use   Smoking status: Former    Current packs/day: 0.00    Types: Cigarettes    Quit date: 2008    Years since quitting: 17.8   Smokeless tobacco: Never  Vaping Use   Vaping status: Never Used  Substance and Sexual Activity   Alcohol use: No   Drug use: Yes    Types: Marijuana    Comment: Daily   Sexual activity: Not on file  Other Topics Concern   Not on file  Social History Narrative   Not on file   Social Drivers of Health   Financial Resource Strain: Not on file  Food Insecurity: No Food Insecurity (01/20/2024)   Hunger Vital Sign    Worried About Running Out of Food in the Last Year: Never true    Ran Out of Food in the Last Year: Never true  Transportation Needs: Not on file  Physical Activity: Not on file  Stress: Not on file  Social Connections: Not on file     Family History  Problem Relation Age of Onset   Hypertension Father    Diabetes Father     Health Maintenance  Topic Date Due   OPHTHALMOLOGY EXAM  Never done   Hepatitis B Vaccines 19-59 Average Risk (1 of 3 - 19+ 3-dose series) Never done   Fecal DNA (Cologuard)  Never done   Pneumococcal Vaccine: 50+ Years (2 of 2 - PCV) 04/27/2021   Zoster Vaccines- Shingrix (1 of 2) Never done   Influenza Vaccine  10/10/2023   COVID-19 Vaccine (1 - 2025-26 season) Never done   HEMOGLOBIN A1C  12/26/2023   Diabetic kidney evaluation - eGFR measurement  06/25/2024   Diabetic kidney evaluation - Urine ACR  06/25/2024   FOOT EXAM  01/19/2025   DTaP/Tdap/Td (2 - Td or Tdap) 06/22/2031   Hepatitis C Screening  Completed   HIV Screening  Completed   HPV VACCINES  Aged Out   Meningococcal B Vaccine  Aged Out     ----------------------------------------------------------------------------------------------------------------------------------------------------------------------------------------------------------------- Physical Exam BP (!) 151/83 (BP Location: Left Arm, Patient Position: Sitting, Cuff Size: Large)   Pulse 95   Ht 5' 8.5 (1.74 m)   Wt 264 lb (  119.7 kg)   SpO2 97%   BMI 39.56 kg/m   Physical Exam Constitutional:      Appearance: Normal appearance.  HENT:     Head: Normocephalic and atraumatic.  Eyes:     General: No scleral icterus. Cardiovascular:     Rate and Rhythm: Normal rate and regular rhythm.  Pulmonary:     Effort: Pulmonary effort is normal.     Breath sounds: Normal breath sounds.  Musculoskeletal:     Cervical back: Neck supple.  Neurological:     Mental Status: He is alert.  Psychiatric:        Mood and Affect: Mood normal.        Behavior: Behavior normal.      ------------------------------------------------------------------------------------------------------------------------------------------------------------------------------------------------------------------- Assessment and Plan  Type 2 diabetes mellitus without complication, without long-term current use of insulin (HCC) Blood sugars are well controlled at this time. Continue metformin  at current strength.    Hypertriglyceridemia Repeat lipid panel.     Essential hypertension BP is pretty well controlled.  Continue on current medications for management of HTN.     No orders of the defined types were placed in this encounter.   Return in about 6 months (around 07/19/2024) for Hypertension, Type 2 Diabetes.

## 2024-01-20 NOTE — Assessment & Plan Note (Signed)
Blood sugars are well controlled at this time. Continue metformin at current strength.

## 2024-01-20 NOTE — Assessment & Plan Note (Signed)
BP is pretty well controlled.  Continue on current medications for management of HTN.

## 2024-01-21 LAB — LIPID PANEL WITH LDL/HDL RATIO
Cholesterol, Total: 173 mg/dL (ref 100–199)
HDL: 45 mg/dL (ref >39)
LDL Chol Calc (NIH): 93 mg/dL (ref 0–99)
LDL/HDL Ratio: 2.1 ratio (ref 0.0–3.6)
Triglycerides: 204 mg/dL — ABNORMAL HIGH (ref 0–149)
VLDL Cholesterol Cal: 35 mg/dL (ref 5–40)

## 2024-01-21 LAB — HEPATIC FUNCTION PANEL
ALT: 13 IU/L (ref 0–44)
AST: 19 IU/L (ref 0–40)
Albumin: 4.6 g/dL (ref 3.8–4.9)
Alkaline Phosphatase: 69 IU/L (ref 47–123)
Bilirubin Total: 0.8 mg/dL (ref 0.0–1.2)
Bilirubin, Direct: 0.21 mg/dL (ref 0.00–0.40)
Total Protein: 7.7 g/dL (ref 6.0–8.5)

## 2024-01-24 ENCOUNTER — Other Ambulatory Visit: Payer: Self-pay | Admitting: Family Medicine

## 2024-01-24 DIAGNOSIS — I10 Essential (primary) hypertension: Secondary | ICD-10-CM

## 2024-01-26 ENCOUNTER — Other Ambulatory Visit: Payer: Self-pay | Admitting: Family Medicine

## 2024-01-26 ENCOUNTER — Telehealth: Payer: Self-pay | Admitting: Family Medicine

## 2024-01-26 DIAGNOSIS — I10 Essential (primary) hypertension: Secondary | ICD-10-CM

## 2024-01-26 MED ORDER — LOSARTAN POTASSIUM 50 MG PO TABS: 50.0000 mg | ORAL_TABLET | Freq: Every day | ORAL | 1 refills | Status: AC

## 2024-01-26 NOTE — Telephone Encounter (Signed)
 Copied from CRM #[id number]. Topic: Clinical - Medication Refill >> Jan 26, 2024  8:58 AM Colin Garcia wrote: Medication:  losartan  (COZAAR ) 50 MG tablet   Has the patient contacted their pharmacy? Yes (Agent: If no, request that the patient contact the pharmacy for the refill. If patient does not wish to contact the pharmacy document the reason why and proceed with request.) (Agent: If yes, when and what did the pharmacy advise?)  This is the patient's preferred pharmacy:  [facility] [address] [place] - [address] [place] [address] Phone: [phone] Fax: [phone]  Is this the correct pharmacy for this prescription? Yes If no, delete pharmacy and type the correct one.   Has the prescription been filled recently? No  Is the patient out of the medication? Yes - Patient without medication since 01/24/2024  Has the patient been seen for an appointment in the last year OR does the patient have an upcoming appointment? Yes  Can we respond through MyChart? Yes  Agent: Please be advised that Rx refills may take up to 3 business days. We ask that you follow-up with your pharmacy.

## 2024-01-26 NOTE — Telephone Encounter (Signed)
 Duplicate request for medication. Request has been routed to provider in another encounter with today's date.

## 2024-01-26 NOTE — Telephone Encounter (Unsigned)
 Copied from CRM #[id number]. Topic: Clinical - Medication Question >> Jan 26, 2024  3:01 PM Donee H wrote: Reason for CRM: Patient calling regarding medication losartan  (COZAAR ) 50 MG tablet. He placed a request for refill earlier today. Advised patient can take up to 3 business days. Patient is stating completely out of medication and needs refill as soon as possible. Please follow up with patient on request

## 2024-01-30 ENCOUNTER — Ambulatory Visit: Payer: Self-pay | Admitting: Family Medicine

## 2024-02-06 ENCOUNTER — Other Ambulatory Visit: Payer: Self-pay | Admitting: Family Medicine

## 2024-02-06 DIAGNOSIS — I10 Essential (primary) hypertension: Secondary | ICD-10-CM

## 2024-02-27 ENCOUNTER — Other Ambulatory Visit: Payer: Self-pay | Admitting: Family Medicine

## 2024-02-27 DIAGNOSIS — E119 Type 2 diabetes mellitus without complications: Secondary | ICD-10-CM

## 2024-02-27 MED ORDER — METFORMIN HCL 500 MG PO TABS: 500.0000 mg | ORAL_TABLET | Freq: Two times a day (BID) | ORAL | 0 refills | Status: AC

## 2024-02-27 NOTE — Telephone Encounter (Signed)
 Copied from CRM #[id number]. Topic: Clinical - Medication Refill >> Feb 27, 2024 10:26 AM Ivette P wrote: Medication: metFORMIN  (GLUCOPHAGE ) 500 MG tablet   Has the patient contacted their pharmacy? Yes (Agent: If no, request that the patient contact the pharmacy for the refill. If patient does not wish to contact the pharmacy document the reason why and proceed with request.) (Agent: If yes, when and what did the pharmacy advise?)  This is the patient's preferred pharmacy:  [facility] [address], [place] - [address] [place] [address] Phone: [phone] Fax: [phone]  [id number] [place], [place] - [address] AT [facility] [address] [place] [address] Phone: [phone] Fax: [phone]  Is this the correct pharmacy for this prescription? Yes If no, delete pharmacy and type the correct one.   Has the prescription been filled recently? No - 10/20/2023  Is the patient out of the medication? Yes  Has the patient been seen for an appointment in the last year OR does the patient have an upcoming appointment? Yes  Can we respond through MyChart? Yes  Agent: Please be advised that Rx refills may take up to 3 business days. We ask that you follow-up with your pharmacy.

## 2024-03-19 ENCOUNTER — Other Ambulatory Visit: Payer: Self-pay | Admitting: Family Medicine

## 2024-03-19 DIAGNOSIS — I10 Essential (primary) hypertension: Secondary | ICD-10-CM

## 2024-04-01 ENCOUNTER — Other Ambulatory Visit: Payer: Self-pay | Admitting: Family Medicine

## 2024-04-01 DIAGNOSIS — I10 Essential (primary) hypertension: Secondary | ICD-10-CM

## 2024-07-19 ENCOUNTER — Ambulatory Visit: Payer: Self-pay | Admitting: Family Medicine
